# Patient Record
Sex: Male | Born: 1970 | Race: White | Hispanic: No | Marital: Married | Smoking: Never smoker
Health system: Southern US, Community
[De-identification: ages and names within clinical notes are randomized; demographics above are authoritative.]

## PROBLEM LIST (undated history)

## (undated) DIAGNOSIS — E669 Obesity, unspecified: Secondary | ICD-10-CM

## (undated) DIAGNOSIS — E785 Hyperlipidemia, unspecified: Secondary | ICD-10-CM

## (undated) DIAGNOSIS — N419 Inflammatory disease of prostate, unspecified: Secondary | ICD-10-CM

## (undated) HISTORY — DX: Hyperlipidemia, unspecified: E78.5

## (undated) HISTORY — PX: ANUS SURGERY: SHX302

## (undated) HISTORY — PX: OTHER SURGICAL HISTORY: SHX169

---

## 1999-10-06 ENCOUNTER — Encounter: Payer: Self-pay | Admitting: Emergency Medicine

## 1999-10-06 ENCOUNTER — Emergency Department (HOSPITAL_COMMUNITY): Admission: EM | Admit: 1999-10-06 | Discharge: 1999-10-06 | Payer: Self-pay | Admitting: Emergency Medicine

## 2001-11-06 ENCOUNTER — Emergency Department (HOSPITAL_COMMUNITY): Admission: EM | Admit: 2001-11-06 | Discharge: 2001-11-06 | Payer: Self-pay | Admitting: Emergency Medicine

## 2002-02-27 ENCOUNTER — Emergency Department (HOSPITAL_COMMUNITY): Admission: EM | Admit: 2002-02-27 | Discharge: 2002-02-27 | Payer: Self-pay | Admitting: Emergency Medicine

## 2002-02-27 ENCOUNTER — Encounter: Payer: Self-pay | Admitting: Emergency Medicine

## 2002-04-19 ENCOUNTER — Ambulatory Visit (HOSPITAL_COMMUNITY): Admission: RE | Admit: 2002-04-19 | Discharge: 2002-04-19 | Payer: Self-pay | Admitting: Sports Medicine

## 2002-04-19 ENCOUNTER — Encounter: Payer: Self-pay | Admitting: Sports Medicine

## 2002-05-25 ENCOUNTER — Encounter: Admission: RE | Admit: 2002-05-25 | Discharge: 2002-07-05 | Payer: Self-pay | Admitting: Sports Medicine

## 2005-08-09 ENCOUNTER — Emergency Department (HOSPITAL_COMMUNITY): Admission: EM | Admit: 2005-08-09 | Discharge: 2005-08-09 | Payer: Self-pay | Admitting: Emergency Medicine

## 2009-03-01 ENCOUNTER — Encounter: Admission: RE | Admit: 2009-03-01 | Discharge: 2009-03-01 | Payer: Self-pay | Admitting: Surgery

## 2011-01-24 ENCOUNTER — Other Ambulatory Visit: Payer: Self-pay

## 2011-01-24 ENCOUNTER — Encounter: Payer: Self-pay | Admitting: *Deleted

## 2011-01-24 ENCOUNTER — Emergency Department (HOSPITAL_BASED_OUTPATIENT_CLINIC_OR_DEPARTMENT_OTHER)
Admission: EM | Admit: 2011-01-24 | Discharge: 2011-01-25 | Disposition: A | Discharge status: Home / Self Care | Payer: Self-pay | Attending: Emergency Medicine | Admitting: Emergency Medicine

## 2011-01-24 ENCOUNTER — Emergency Department (INDEPENDENT_AMBULATORY_CARE_PROVIDER_SITE_OTHER): Payer: Self-pay

## 2011-01-24 DIAGNOSIS — R079 Chest pain, unspecified: Secondary | ICD-10-CM

## 2011-01-24 DIAGNOSIS — R0789 Other chest pain: Secondary | ICD-10-CM

## 2011-01-24 DIAGNOSIS — D7389 Other diseases of spleen: Secondary | ICD-10-CM

## 2011-01-24 DIAGNOSIS — N281 Cyst of kidney, acquired: Secondary | ICD-10-CM

## 2011-01-24 DIAGNOSIS — R109 Unspecified abdominal pain: Secondary | ICD-10-CM | POA: Insufficient documentation

## 2011-01-24 DIAGNOSIS — R52 Pain, unspecified: Secondary | ICD-10-CM

## 2011-01-24 DIAGNOSIS — S20219A Contusion of unspecified front wall of thorax, initial encounter: Secondary | ICD-10-CM | POA: Insufficient documentation

## 2011-01-24 DIAGNOSIS — W1789XA Other fall from one level to another, initial encounter: Secondary | ICD-10-CM | POA: Insufficient documentation

## 2011-01-24 DIAGNOSIS — W19XXXA Unspecified fall, initial encounter: Secondary | ICD-10-CM

## 2011-01-24 LAB — CBC
MCH: 31.6 pg (ref 26.0–34.0)
MCHC: 33.6 g/dL (ref 30.0–36.0)
RDW: 13 % (ref 11.5–15.5)

## 2011-01-24 LAB — COMPREHENSIVE METABOLIC PANEL
AST: 24 U/L (ref 0–37)
Albumin: 3.8 g/dL (ref 3.5–5.2)
Alkaline Phosphatase: 74 U/L (ref 39–117)
BUN: 24 mg/dL — ABNORMAL HIGH (ref 6–23)
Chloride: 105 mEq/L (ref 96–112)
Creatinine, Ser: 1.1 mg/dL (ref 0.50–1.35)
Potassium: 3.8 mEq/L (ref 3.5–5.1)
Total Bilirubin: 0.4 mg/dL (ref 0.3–1.2)
Total Protein: 7 g/dL (ref 6.0–8.3)

## 2011-01-24 LAB — DIFFERENTIAL
Basophils Absolute: 0.1 10*3/uL (ref 0.0–0.1)
Basophils Relative: 1 % (ref 0–1)
Eosinophils Absolute: 0.3 10*3/uL (ref 0.0–0.7)
Neutro Abs: 5.4 10*3/uL (ref 1.7–7.7)
Neutrophils Relative %: 60 % (ref 43–77)

## 2011-01-24 LAB — CARDIAC PANEL(CRET KIN+CKTOT+MB+TROPI)
CK, MB: 4.8 ng/mL — ABNORMAL HIGH (ref 0.3–4.0)
Relative Index: 1.3 (ref 0.0–2.5)
Total CK: 365 U/L — ABNORMAL HIGH (ref 7–232)

## 2011-01-24 MED ORDER — SODIUM CHLORIDE 0.9 % IV SOLN
INTRAVENOUS | Status: DC
  Administered 2011-01-24: 20:00:00 via INTRAVENOUS

## 2011-01-24 MED ORDER — FENTANYL CITRATE 0.05 MG/ML IJ SOLN
50.0000 ug | Freq: Once | INTRAMUSCULAR | Status: AC
  Administered 2011-01-24: 50 ug via INTRAVENOUS
  Filled 2011-01-24: qty 2

## 2011-01-24 MED ORDER — MORPHINE SULFATE 4 MG/ML IJ SOLN
4.0000 mg | Freq: Once | INTRAMUSCULAR | Status: AC
  Administered 2011-01-24: 4 mg via INTRAVENOUS
  Filled 2011-01-24: qty 1

## 2011-01-24 MED ORDER — IOHEXOL 350 MG/ML SOLN
80.0000 mL | Freq: Once | INTRAVENOUS | Status: AC | PRN
  Administered 2011-01-24: 80 mL via INTRAVENOUS

## 2011-01-24 NOTE — ED Notes (Signed)
Patient transported to CT 

## 2011-01-24 NOTE — ED Notes (Signed)
Pt states that he would like something else for his pain, fentanyl did not provide this patient with much relief, per pt.  Dr Nicanor Alcon notified.

## 2011-01-24 NOTE — ED Provider Notes (Signed)
History  Scribed for Scott Larson Smitty Cords, MD, the patient was seen in room MH05/MH05. This chart was scribed by Candelaria Stagers. The patient's care started at 8:03 PM    CSN: [id number] Arrival date & time: 01/24/2011  7:54 PM   First MD Initiated Contact with Patient 01/24/11 1955      Chief Complaint  Patient presents with  . Rib Injury     Patient is a 40 y.o. male presenting with fall. The history is provided by the patient.  Fall The accident occurred more than 1 week ago. The fall occurred while recreating/playing (jumped on a 4 foot tall beach ball that popped and has had right rib pain since.  No DOE, no SOB, no n/v/d.  No long car trips or plane trips no swelling of the lower extremities.  ). He fell from a height of 6 to 10 ft. He landed on dirt. There was no blood loss. Point of impact: chest. Pain location: ribs right > left. The pain is at a severity of 9/10. The pain is severe. He was not ambulatory at the scene. There was no entrapment after the fall. There was no drug use involved in the accident. There was no alcohol use involved in the accident. Pertinent negatives include no visual change, no fever, no numbness, no abdominal pain, no bowel incontinence, no nausea, no vomiting, no hematuria, no headaches and no loss of consciousness. The symptoms are aggravated by sitting (palpation). He has tried nothing for the symptoms. The treatment provided no relief.   AREK Larson is a 40 y.o. male who presents to the Emergency Department complaining of constant rib pain after experiencing a fall from 20ft two weeks ago.  Pt states that the pain is worse today.  Sitting and pressing on the area makes the pain worse.  He denies swelling, SOB, back pain, or nausea.  He reports that he has not travelled recently.  He has not taken any medication for the pain.       History reviewed. No pertinent past medical history.  History reviewed. No pertinent past surgical history.  History  reviewed. No pertinent family history.  History  Substance Use Topics  . Smoking status: Never Smoker   . Smokeless tobacco: Not on file  . Alcohol Use: Yes      Review of Systems  Constitutional: Negative for fever and chills.  HENT: Negative for neck pain and neck stiffness.   Eyes: Negative for pain.  Respiratory: Negative for chest tightness and shortness of breath.   Cardiovascular: Positive for chest pain (rib pain).  Gastrointestinal: Negative for nausea, vomiting, abdominal pain, diarrhea and bowel incontinence.  Genitourinary: Negative for dysuria and hematuria.  Musculoskeletal: Negative for back pain, joint swelling and gait problem.  Skin: Negative for rash.  Neurological: Negative for loss of consciousness, light-headedness, numbness and headaches.  Hematological: Negative.   Psychiatric/Behavioral: Negative for confusion and agitation.    Allergies  Aspirin  Home Medications   Current Outpatient Rx  Name Route Sig Dispense Refill  . ACYCLOVIR 400 MG PO TABS Oral Take 400 mg by mouth daily as needed. For breakout     . NAPHAZOLINE HCL 0.012 % OP SOLN Both Eyes Place 1 drop into both eyes once as needed. For dry eyes     . OVER THE COUNTER MEDICATION Oral Take 1 capsule by mouth daily. Mushroom capsule supplement     . OVER THE COUNTER MEDICATION Oral Take 1 capsule by mouth daily. Immune  defense supplement       BP 125/77  Pulse 58  Temp(Src) 97.9 F (36.6 C) (Oral)  Resp 20  Ht 6' (1.829 m)  Wt 275 lb (124.739 kg)  BMI 37.30 kg/m2  SpO2 99%  Physical Exam  Nursing note and vitals reviewed. Constitutional: He appears well-developed and well-nourished. No distress.  HENT:  Head: Normocephalic and atraumatic.  Mouth/Throat: Oropharynx is clear and moist. No oropharyngeal exudate.  Eyes: Conjunctivae and EOM are normal. Pupils are equal, round, and reactive to light.  Neck: No JVD present. No thyromegaly present.  Cardiovascular: Normal rate and  regular rhythm.        normal S1, S2  Pulmonary/Chest: Effort normal. No stridor. No respiratory distress. He has no wheezes. He has no rales. He exhibits tenderness.       No Crepitus.   Abdominal: Soft. Bowel sounds are normal. He exhibits no distension and no mass. There is no tenderness. There is no guarding.       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    Musculoskeletal:       No point tenderness over the spine.   Lymphadenopathy:    He has no cervical adenopathy.  Neurological: He is alert.       L5, L1 intact.  No loss of sensation.   Skin: Skin is warm and dry.  Psychiatric: He has a normal mood and affect.    ED Course  Procedures  DIAGNOSTIC STUDIES: Oxygen Saturation is 99% on room air, normal by my interpretation.    COORDINATION OF CARE:  8:12Pm Ordered: CBC ; Differential ; Comprehensive metabolic panel ; Cardiac panel Timed (cret kin+cktot+mb+tropi)  8:20PM Ordered: DG CHEST 2 VIEW,fentaNYL (SUBLIMAZE) injection 50 mcg  8:27PM Ordered: 0.9 % sodium chloride infusion - Rate: 20 mL/hr ; Route: Intravenous ; Scheduled Time: 2030  8:59PM Ordered: morphine 4 MG/ML injection 4 mg  10:11PM Ordered: CT ANGIO CHEST W/CM &/OR WO CM  10:22PM Ordered: CT ABDOMEN PELVIS W CONTRAST  10:45PM Ordered: iohexol (OMNIPAQUE) 350 MG/ML injection 80 mL  12:46AM Recheck: Discussed results with pt and course of care.   Labs Reviewed  COMPREHENSIVE METABOLIC PANEL - Abnormal; Notable for the following:    BUN 24 (*)    GFR calc non Af Amer 81 (*)    All other components within normal limits  CARDIAC PANEL(CRET KIN+CKTOT+MB+TROPI) - Abnormal; Notable for the following:    Total CK 365 (*)    CK, MB 4.8 (*)    All other components within normal limits  CBC  DIFFERENTIAL   Dg Chest 2 View  01/24/2011  *RADIOLOGY REPORT*  Clinical Data: Fall, pain  CHEST - 2 VIEW  Comparison: 03/01/2009  Findings: Cardiac and mediastinal contours are normal.  Lungs are clear without  infiltrate or effusion.  IMPRESSION: No active cardiopulmonary disease.  Original Report Authenticated By: Camelia Phenes, M.D.   Ct Angio Chest W/cm &/or Wo Cm  01/24/2011  *RADIOLOGY REPORT*  Clinical Data:  Chest pain  CT ANGIOGRAPHY CHEST WITH CONTRAST  Technique:  Multidetector CT imaging of the chest was performed using the standard protocol during bolus administration of intravenous contrast.  Multiplanar CT image reconstructions including MIPs were obtained to evaluate the vascular anatomy.  Contrast: 80mL OMNIPAQUE IOHEXOL 350 MG/ML IV SOLN  Comparison:  01/24/2011 radiograph  Findings:  Pulmonary arterial branches are patent.  Normal caliber aorta and great vessels.  Normal heart size.  No pleural or pericardial effusion.  No intrathoracic lymphadenopathy.  Central airways are patent.  Lungs are clear.  No pneumothorax.  Limited images through the upper abdomen show no acute abnormality. See contemporaneous abdominal CT report.  Multilevel degenerative changes.  No acute osseous abnormality.  Review of the MIP images confirms the above findings.  IMPRESSION: No pulmonary embolism or acute intrathoracic process.  Original Report Authenticated By: Waneta Martins, M.D.   Ct Abdomen Pelvis W Contrast  01/24/2011  *RADIOLOGY REPORT*  Clinical Data: Left-sided chest discomfort and left flank pain.  CT ABDOMEN AND PELVIS WITH CONTRAST  Technique:  Multidetector CT imaging of the abdomen and pelvis was performed following the standard protocol during bolus administration of intravenous contrast.  Contrast:  80 ml Omnipaque 350  Comparison: None.  Findings: Lung bases are clear.  Punctate splenic calcification is in keeping with prior granulomas infection.  Unremarkable liver, spleen, pancreas, adrenal glands, and left kidney.  There is a 1.5 cm simple cyst arising from the interpolar right kidney laterally.  No hydronephrosis or hydroureter.  No bowel obstruction or CT evidence for colitis.  Normal  appendix. No free intraperitoneal air or fluid.  No lymphadenopathy.  There is scattered atherosclerotic calcification of the aorta and its branches. No aneurysmal dilatation.  Thin-walled bladder.  There is no acute osseous abnormality.  IMPRESSION: No acute or traumatic abnormality within the abdomen pelvis.  Original Report Authenticated By: Waneta Martins, M.D.     No diagnosis found.   Date: 01/25/2011  Rate: 47  Rhythm: sinus bradycardia  QRS Axis: normal  Intervals: normal  ST/T Wave abnormalities: normal  Conduction Disutrbances:none  Narrative Interpretation:   Old EKG Reviewed: unchanged   MDM  Patient states adamantly the pain is not related to his heart and he feels fine post meds.  Denies DOE, SOB no nausea vomiting or diaphoresis.  States only thing that makes it worse is palpation and he was mainly concerned about an injury to his liver.  Refusing admission at this time.  States he will follow up with his doctor for ongoing care.  Told to return no dyspnea on exertion shortness of breath nausea vomiting or diaphoresis.  Patient verbalizes understanding and agrees to follow up.  PERC negative.   I personally performed the services described in this documentation, which was scribed in my presence. The recorded information has been reviewed and considered.       Andi Mahaffy Smitty Cords, MD 01/25/11 0110

## 2011-01-24 NOTE — ED Notes (Signed)
Pt describes right rib and RUQ pain x 2 weeks. Worse tonight. Hx fall 2 weeks ago. States he can "feel something moving" when he presses on it. Denies other s/s

## 2011-01-25 LAB — CARDIAC PANEL(CRET KIN+CKTOT+MB+TROPI)
CK, MB: 4.8 ng/mL — ABNORMAL HIGH (ref 0.3–4.0)
Total CK: 386 U/L — ABNORMAL HIGH (ref 7–232)

## 2011-01-25 MED ORDER — HYDROCODONE-ACETAMINOPHEN 5-500 MG PO TABS: 2.0000 | ORAL_TABLET | Freq: Four times a day (QID) | ORAL | Status: AC | PRN

## 2011-01-25 NOTE — ED Notes (Signed)
D/c home- no rx given- EDP Palumbo advised of pt's heartrate 45 at d/c- states ok to d/c pt home

## 2011-02-15 ENCOUNTER — Emergency Department (HOSPITAL_BASED_OUTPATIENT_CLINIC_OR_DEPARTMENT_OTHER)
Admission: EM | Admit: 2011-02-15 | Discharge: 2011-02-15 | Discharge status: Against Medical Advice | Payer: Self-pay | Attending: Emergency Medicine | Admitting: Emergency Medicine

## 2011-02-15 ENCOUNTER — Encounter (HOSPITAL_BASED_OUTPATIENT_CLINIC_OR_DEPARTMENT_OTHER): Payer: Self-pay | Admitting: *Deleted

## 2011-02-15 DIAGNOSIS — R109 Unspecified abdominal pain: Secondary | ICD-10-CM | POA: Insufficient documentation

## 2011-02-15 HISTORY — DX: Inflammatory disease of prostate, unspecified: N41.9

## 2011-02-15 LAB — URINALYSIS, ROUTINE W REFLEX MICROSCOPIC
Bilirubin Urine: NEGATIVE
Glucose, UA: NEGATIVE mg/dL
Ketones, ur: NEGATIVE mg/dL
Protein, ur: NEGATIVE mg/dL
pH: 5.5 (ref 5.0–8.0)

## 2011-02-15 NOTE — ED Notes (Signed)
Pt called for the 3rd time. No answer.

## 2011-02-15 NOTE — ED Notes (Signed)
Patient called to be roomed and no answer. Triage RN aware

## 2011-02-15 NOTE — ED Notes (Signed)
Pt states he is having s/s similar to when he had prostatitis. Groin pain. Feels like "heat". Urgency.

## 2011-02-16 ENCOUNTER — Encounter (HOSPITAL_BASED_OUTPATIENT_CLINIC_OR_DEPARTMENT_OTHER): Payer: Self-pay | Admitting: Family Medicine

## 2011-02-16 ENCOUNTER — Emergency Department (HOSPITAL_BASED_OUTPATIENT_CLINIC_OR_DEPARTMENT_OTHER)
Admission: EM | Admit: 2011-02-16 | Discharge: 2011-02-16 | Disposition: A | Discharge status: Home / Self Care | Payer: Self-pay | Attending: Emergency Medicine | Admitting: Emergency Medicine

## 2011-02-16 DIAGNOSIS — R109 Unspecified abdominal pain: Secondary | ICD-10-CM | POA: Insufficient documentation

## 2011-02-16 DIAGNOSIS — N419 Inflammatory disease of prostate, unspecified: Secondary | ICD-10-CM | POA: Insufficient documentation

## 2011-02-16 DIAGNOSIS — R300 Dysuria: Secondary | ICD-10-CM | POA: Insufficient documentation

## 2011-02-16 DIAGNOSIS — Z79899 Other long term (current) drug therapy: Secondary | ICD-10-CM | POA: Insufficient documentation

## 2011-02-16 LAB — URINALYSIS, ROUTINE W REFLEX MICROSCOPIC
Bilirubin Urine: NEGATIVE
Glucose, UA: NEGATIVE mg/dL
Hgb urine dipstick: NEGATIVE
Ketones, ur: NEGATIVE mg/dL
Specific Gravity, Urine: 1.025 (ref 1.005–1.030)
pH: 7 (ref 5.0–8.0)

## 2011-02-16 MED ORDER — CIPROFLOXACIN HCL 500 MG PO TABS
500.0000 mg | ORAL_TABLET | Freq: Once | ORAL | Status: AC
  Administered 2011-02-16: 500 mg via ORAL
  Filled 2011-02-16: qty 1

## 2011-02-16 MED ORDER — AZITHROMYCIN 250 MG PO TABS
1000.0000 mg | ORAL_TABLET | Freq: Once | ORAL | Status: AC
  Administered 2011-02-16: 1000 mg via ORAL
  Filled 2011-02-16: qty 4

## 2011-02-16 MED ORDER — CIPROFLOXACIN HCL 500 MG PO TABS: 500.0000 mg | ORAL_TABLET | Freq: Two times a day (BID) | ORAL | Status: AC

## 2011-02-16 MED ORDER — ACYCLOVIR 400 MG PO TABS: 400.0000 mg | ORAL_TABLET | Freq: Four times a day (QID) | ORAL | Status: AC

## 2011-02-16 MED ORDER — CEFTRIAXONE SODIUM 250 MG IJ SOLR
250.0000 mg | Freq: Once | INTRAMUSCULAR | Status: AC
  Administered 2011-02-16: 250 mg via INTRAMUSCULAR
  Filled 2011-02-16: qty 250

## 2011-02-16 NOTE — ED Notes (Signed)
Pt c/o "pinching sensation at tip of my penis".  Pt also c/o "warmness sensation from anus to penis". Pt able to urinate.

## 2011-02-16 NOTE — ED Notes (Signed)
meds given to Pt.   Introduced self to pt. And plan of care.  No needs per pt.

## 2011-02-16 NOTE — ED Provider Notes (Signed)
History     CSN: [id number]  Arrival date & time 02/16/11  1132   First MD Initiated Contact with Patient 02/16/11 1315      Chief Complaint  Patient presents with  . Groin Pain    (Consider location/radiation/quality/duration/timing/severity/associated sxs/prior treatment) HPI Comments: Describes a pinching and burning sensation at the tip of the penis as well as the scrotum and rectum. Has a history of prostatitis in the past. States feels similar. Also requests a prescription for acyclovir as he feels like he is getting a cold sore. Concern about STI has his wife and him were separated for 2 months and he fears that she was not monogamous.  Patient is a 41 y.o. male presenting with groin pain. The history is provided by the patient. No language interpreter was used.  Groin Pain This is a new problem. Episode onset: 1-2 weeks. The problem occurs constantly. The problem has been gradually worsening. Pertinent negatives include no chest pain, no abdominal pain, no headaches and no shortness of breath. Exacerbated by: urinating. The symptoms are relieved by nothing. He has tried nothing for the symptoms.    Past Medical History  Diagnosis Date  . Prostatitis     Past Surgical History  Procedure Date  . Anus surgery     No family history on file.  History  Substance Use Topics  . Smoking status: Never Smoker   . Smokeless tobacco: Not on file  . Alcohol Use: Yes      Review of Systems  Constitutional: Negative for fever, activity change, appetite change and fatigue.  HENT: Negative for congestion, sore throat, rhinorrhea, neck pain and neck stiffness.   Respiratory: Negative for cough and shortness of breath.   Cardiovascular: Negative for chest pain and palpitations.  Gastrointestinal: Negative for nausea, vomiting and abdominal pain.  Genitourinary: Positive for dysuria, penile pain and testicular pain. Negative for urgency, frequency, flank pain, discharge, penile  swelling, scrotal swelling, difficulty urinating and genital sores.  Musculoskeletal: Negative for myalgias, back pain and arthralgias.  Neurological: Negative for dizziness, weakness, light-headedness, numbness and headaches.  All other systems reviewed and are negative.    Allergies  Aspirin  Home Medications   Current Outpatient Rx  Name Route Sig Dispense Refill  . ACYCLOVIR 400 MG PO TABS Oral Take 400 mg by mouth daily as needed. For breakout     . ACYCLOVIR 400 MG PO TABS Oral Take 1 tablet (400 mg total) by mouth 4 (four) times daily. 50 tablet 0  . CIPROFLOXACIN HCL 500 MG PO TABS Oral Take 1 tablet (500 mg total) by mouth every 12 (twelve) hours. 60 tablet 0  . OVER THE COUNTER MEDICATION Oral Take 1 capsule by mouth daily. Mushroom capsule supplement     . OVER THE COUNTER MEDICATION Oral Take 1 capsule by mouth daily. Immune defense supplement       BP 142/80  Pulse 50  Temp(Src) 97 F (36.1 C) (Oral)  Resp 16  Ht 6' (1.829 m)  Wt 290 lb (131.543 kg)  BMI 39.33 kg/m2  SpO2 97%  Physical Exam  Nursing note and vitals reviewed. Constitutional: He is oriented to person, place, and time. He appears well-developed and well-nourished. No distress.  HENT:  Head: Normocephalic and atraumatic.  Mouth/Throat: Oropharynx is clear and moist.  Eyes: Conjunctivae and EOM are normal. Pupils are equal, round, and reactive to light.  Neck: Normal range of motion. Neck supple.  Cardiovascular: Normal rate, regular rhythm, normal heart sounds  and intact distal pulses.  Exam reveals no gallop and no friction rub.   No murmur heard. Pulmonary/Chest: Effort normal and breath sounds normal.  Abdominal: Soft. There is no tenderness. Hernia confirmed negative in the right inguinal area and confirmed negative in the left inguinal area.  Genitourinary: Rectum normal, testes normal and penis normal. Prostate is tender. Cremasteric reflex is present. Circumcised.  Musculoskeletal: Normal  range of motion. He exhibits no tenderness.  Lymphadenopathy:       Right: No inguinal adenopathy present.       Left: No inguinal adenopathy present.  Neurological: He is alert and oriented to person, place, and time. No cranial nerve deficit.  Skin: Skin is warm and dry.    ED Course  Procedures (including critical care time)   Labs Reviewed  URINALYSIS, ROUTINE W REFLEX MICROSCOPIC  GC/CHLAMYDIA PROBE AMP, URINE   No results found.   1. Prostatitis       MDM  Urinalysis was performed and unremarkable. A dirty urine will be performed to rule out GC and Chlamydia. Patient states he will perform additional testing via the health department. I treated him empirically for gonorrhea and Chlamydia. I will also treat him with Cipro for prostatitis. There is no indication for imaging at this time. He will also be given a prescription for acyclovir per his request. Instructed to followup with his primary care physician as well as urology        Dayton Bailiff, MD 02/16/11 1443

## 2011-02-17 LAB — GC/CHLAMYDIA PROBE AMP, URINE: Chlamydia, Swab/Urine, PCR: NEGATIVE

## 2011-06-08 ENCOUNTER — Encounter (HOSPITAL_BASED_OUTPATIENT_CLINIC_OR_DEPARTMENT_OTHER): Payer: Self-pay | Admitting: *Deleted

## 2011-06-08 ENCOUNTER — Emergency Department (HOSPITAL_BASED_OUTPATIENT_CLINIC_OR_DEPARTMENT_OTHER)
Admission: EM | Admit: 2011-06-08 | Discharge: 2011-06-08 | Disposition: A | Discharge status: Home / Self Care | Payer: Self-pay | Attending: Emergency Medicine | Admitting: Emergency Medicine

## 2011-06-08 DIAGNOSIS — R599 Enlarged lymph nodes, unspecified: Secondary | ICD-10-CM | POA: Insufficient documentation

## 2011-06-08 DIAGNOSIS — R51 Headache: Secondary | ICD-10-CM | POA: Insufficient documentation

## 2011-06-08 DIAGNOSIS — J329 Chronic sinusitis, unspecified: Secondary | ICD-10-CM | POA: Insufficient documentation

## 2011-06-08 DIAGNOSIS — J309 Allergic rhinitis, unspecified: Secondary | ICD-10-CM | POA: Insufficient documentation

## 2011-06-08 LAB — RAPID STREP SCREEN (MED CTR MEBANE ONLY): Streptococcus, Group A Screen (Direct): NEGATIVE

## 2011-06-08 MED ORDER — OXYMETAZOLINE HCL 0.05 % NA SOLN: 2.0000 | Freq: Two times a day (BID) | NASAL | Status: AC

## 2011-06-08 MED ORDER — AMOXICILLIN-POT CLAVULANATE 875-125 MG PO TABS: 1.0000 | ORAL_TABLET | Freq: Two times a day (BID) | ORAL | Status: AC

## 2011-06-08 NOTE — ED Notes (Signed)
Wants to make sure he does not have a sinus infection. Facial pain and sore throat.

## 2011-06-08 NOTE — ED Provider Notes (Signed)
History   This chart was scribed for Forbes Cellar, MD by Melba Coon. The patient was seen in room MH11/MH11 and the patient's care was started at 3:45PM.    CSN: [id number]  Arrival date & time 06/08/11  1446   First MD Initiated Contact with Patient 06/08/11 1538      No chief complaint on file.   (Consider location/radiation/quality/duration/timing/severity/associated sxs/prior treatment) HPI Scott Larson is a 41 y.o. male who presents to the Emergency Department complaining of   Julieanne Manson, RN 06/08/2011 14:54 Wants to make sure he does not have a sinus infection. Facial pain and sore throat.    Pt thinks he got bronchitis; took augmentin (10-12 over 3 day period) about 2.5 weeks ago for sinus pressure and productive cough. About a week ago, throat pressure on right side; this week pressure in throat is on left side and also left side of head is pounding around left eyebrow. Pt aggressively scratch throat during night to the point where it wakes up pt's wife. Pt also shows a picture of "fleshy red" phlegm that he coughed up in November. Pt hasn't been to PCP in many years. Dry cough is now present. SOB present. No HA, otalgia, fever, neck pain, sore throat, rash, back pain, CP, abd pain, n/v/d, dysuria, or extremity pain, edema, weakness, numbness, or tingling. Allergic to ASA; has seasonal allergies as well; takes zyrtec. Hx of acid reflux; says it feels slightly similar to present symptoms but also different. No other pertinent medical symptoms.  PCP: Dr. Lorenz Coaster   ED Notes, ED Provider Notes from 06/08/11 0000 to 06/08/11 14:57:04       Julieanne Manson, RN 06/08/2011 14:54      Wants to make sure he does not have a sinus infection. Facial pain and sore throat.      Past Medical History  Diagnosis Date  . Prostatitis   . Asthma     Past Surgical History  Procedure Date  . Anus surgery     No family history on file.  History  Substance Use Topics  .  Smoking status: Never Smoker   . Smokeless tobacco: Not on file  . Alcohol Use: Yes      Review of Systems 10 Systems reviewed and all are negative for acute change except as noted in the HPI.   Allergies  Aspirin  Home Medications   Current Outpatient Rx  Name Route Sig Dispense Refill  . ALBUTEROL SULFATE HFA 108 (90 BASE) MCG/ACT IN AERS Inhalation Inhale into the lungs every 6 (six) hours as needed. Patient used this medication for shortness of breath.    . AMOXICILLIN-POT CLAVULANATE 875-125 MG PO TABS Oral Take 1 tablet by mouth 2 (two) times daily. 20 tablet 0  . OXYMETAZOLINE HCL 0.05 % NA SOLN Nasal Place 2 sprays into the nose 2 (two) times daily. 30 mL 0    BP 142/83  Pulse 61  Temp(Src) 98.1 F (36.7 C) (Oral)  Resp 20  SpO2 100%  Physical Exam  Nursing note and vitals reviewed. Constitutional: He is oriented to person, place, and time. He appears well-developed and well-nourished.       Awake, alert, nontoxic appearance.  HENT:  Head: Normocephalic and atraumatic.       Left Ear: Middle ear effusion around the left TM; non-infected Throat: No tonsillar swelling with minimal erythema; minimal white exudate on tonsils Head: No ttp over maxillary sinus; minimal tenderness on left ventril sinus  Eyes: EOM are normal. Pupils are equal, round, and reactive to light. Right eye exhibits no discharge. Left eye exhibits no discharge.  Neck: Normal range of motion. Neck supple.       Lt submandibular tender mobile LAD  Cardiovascular: Normal rate and normal heart sounds.   No murmur heard. Pulmonary/Chest: Effort normal. He exhibits no tenderness.  Abdominal: Soft. There is no tenderness. There is no rebound.  Musculoskeletal: He exhibits no tenderness.       Baseline ROM, no obvious new focal weakness.  Neurological: He is alert and oriented to person, place, and time.       Mental status and motor strength appears baseline for patient and situation.  Skin: Skin  is warm. No rash noted.  Psychiatric: He has a normal mood and affect. His behavior is normal.    ED Course  Procedures (including critical care time)  DIAGNOSTIC STUDIES: Oxygen Saturation is 100% on room air, normal by my interpretation.    COORDINATION OF CARE:  3:50PM - EDMD will order strep test for the pt.    Labs Reviewed  RAPID STREP SCREEN   No results found.   1. Sinusitis   2. Allergic rhinitis     MDM  Likely allergic rhinitis. Continue zyrtec. Afrin. Will give watch and wait prescription for sinusitis. No fevers at this time but does have tenderness to percussion frontal sinuses. No EMC precluding discharge at this time. Given Precautions for return. PMD f/u.   I personally performed the services described in this documentation, which was scribed in my presence. The recorded information has been reviewed and considered.         Forbes Cellar, MD 06/08/11 [phone]

## 2011-06-08 NOTE — Discharge Instructions (Signed)
Allergic Rhinitis Allergic rhinitis is when the mucous membranes in the nose respond to allergens. Allergens are particles in the air that cause your body to have an allergic reaction. This causes you to release allergic antibodies. Through a chain of events, these eventually cause you to release histamine into the blood stream (hence the use of antihistamines). Although meant to be protective to the body, it is this release that causes your discomfort, such as frequent sneezing, congestion and an itchy runny nose.  CAUSES  The pollen allergens may come from grasses, trees, and weeds. This is seasonal allergic rhinitis, or "hay fever." Other allergens cause year-round allergic rhinitis (perennial allergic rhinitis) such as house dust mite allergen, pet dander and mold spores.  SYMPTOMS   Nasal stuffiness (congestion).   Runny, itchy nose with sneezing and tearing of the eyes.   There is often an itching of the mouth, eyes and ears.  It cannot be cured, but it can be controlled with medications. DIAGNOSIS  If you are unable to determine the offending allergen, skin or blood testing may find it. TREATMENT   Avoid the allergen.   Medications and allergy shots (immunotherapy) can help.   Hay fever may often be treated with antihistamines in pill or nasal spray forms. Antihistamines block the effects of histamine. There are over-the-counter medicines that may help with nasal congestion and swelling around the eyes. Check with your caregiver before taking or giving this medicine.  If the treatment above does not work, there are many new medications your caregiver can prescribe. Stronger medications may be used if initial measures are ineffective. Desensitizing injections can be used if medications and avoidance fails. Desensitization is when a patient is given ongoing shots until the body becomes less sensitive to the allergen. Make sure you follow up with your caregiver if problems continue. SEEK  MEDICAL CARE IF:   You develop fever (more than 100.5 F (38.1 C).   You develop a cough that does not stop easily (persistent).   You have shortness of breath.   You start wheezing.   Symptoms interfere with normal daily activities.  Document Released: 10/21/2000 Document Revised: 01/15/2011 Document Reviewed: 05/02/2008 Exit[employer] Patient Information 2012 [employer]  RESOURCE GUIDE  Dental Problems  Patients with Medicaid: [facility]                     [facility] [address]                                           [address] Phone:  [phone]                                                   Phone:  [phone]  If unable to pay or uninsured, contact:  [facility] or [facility] to become qualified for the adult dental clinic.  Chronic Pain Problems Contact [facility]  [phone] Patients need to be referred by their primary care doctor.  Insufficient Money for Medicine Contact [employer]:  call "211" or [facility] 271-5999.  No Primary Care Doctor Call [facility]  [phone] Other agencies that provide inexpensive medical care      [facility]  [phone]    [facility]  [phone]    [facility]  [phone]    [facility]  [phone]    [facility]  [phone]    [facility]  [phone]  [facility]  [phone] [facility]  [phone] [facility]   [phone] (emergency services [phone])  Abuse/Neglect [place] Child Abuse Hotline (336) 641-3795 [place] [facility] Hotline 800-378-5315 (After Hours)  Emergency Shelter [facility] (336) 271-5985  [facility] (336) 275-9566 [facility] (704) 372-4663  MRSA Hotline #:   832-7006    [place] [facility]  [facility]  [place]  [employer]                           [facility] [address] [place]                     [address]         [address]  [place]                                               [place]                              [place] Phone:  [phone]                                  Phone:  [phone]                   Phone:  [phone]  [facility] Phone:  [phone]  [place] Child Abuse Hotline (336) 342-1394 (336) 342-3537 (After Hours)  

## 2011-06-09 LAB — STREP A DNA PROBE
Group A Strep Probe: NEGATIVE
Special Requests: NORMAL

## 2011-09-07 ENCOUNTER — Encounter (HOSPITAL_BASED_OUTPATIENT_CLINIC_OR_DEPARTMENT_OTHER): Payer: Self-pay | Admitting: Emergency Medicine

## 2011-09-07 ENCOUNTER — Emergency Department (HOSPITAL_BASED_OUTPATIENT_CLINIC_OR_DEPARTMENT_OTHER)
Admission: EM | Admit: 2011-09-07 | Discharge: 2011-09-07 | Disposition: A | Discharge status: Home / Self Care | Payer: Self-pay | Attending: Emergency Medicine | Admitting: Emergency Medicine

## 2011-09-07 DIAGNOSIS — Z202 Contact with and (suspected) exposure to infections with a predominantly sexual mode of transmission: Secondary | ICD-10-CM | POA: Insufficient documentation

## 2011-09-07 LAB — URINALYSIS, ROUTINE W REFLEX MICROSCOPIC
Bilirubin Urine: NEGATIVE
Hgb urine dipstick: NEGATIVE
Ketones, ur: NEGATIVE mg/dL
Specific Gravity, Urine: 1.025 (ref 1.005–1.030)
Urobilinogen, UA: 0.2 mg/dL (ref 0.0–1.0)

## 2011-09-07 MED ORDER — LIDOCAINE HCL (PF) 1 % IJ SOLN
INTRAMUSCULAR | Status: AC
  Administered 2011-09-07: 5 mL
  Filled 2011-09-07: qty 5

## 2011-09-07 MED ORDER — CEFTRIAXONE SODIUM 250 MG IJ SOLR
250.0000 mg | Freq: Once | INTRAMUSCULAR | Status: AC
  Administered 2011-09-07: 250 mg via INTRAMUSCULAR
  Filled 2011-09-07: qty 250

## 2011-09-07 MED ORDER — AZITHROMYCIN 250 MG PO TABS
1000.0000 mg | ORAL_TABLET | Freq: Once | ORAL | Status: AC
  Administered 2011-09-07: 1000 mg via ORAL
  Filled 2011-09-07: qty 4

## 2011-09-07 NOTE — ED Notes (Signed)
Concerned about STD exposure because he had been separated from his wife and recently got back together.  They were sexually active and she confessed she had slept with another man during the break up.  He is now concerned that she may have been exposed and now he is too.  No symptoms from either.

## 2011-09-08 LAB — GC/CHLAMYDIA PROBE AMP, GENITAL: Chlamydia, DNA Probe: NEGATIVE

## 2011-09-11 NOTE — ED Provider Notes (Signed)
History     CSN: [id number]  Arrival date & time 09/07/11  1324   First MD Initiated Contact with Patient 09/07/11 [phone]      Chief Complaint  Patient presents with  . Exposure to STD    (Consider location/radiation/quality/duration/timing/severity/associated sxs/prior treatment) HPI Patient is a 41 yo very anxious male presenting with concern over increased urinary frequency as well as possible STI exposure.  Patient had separated from wife and after reconciliation and unprotected sex he was notified that she had had protected sex with a new partner.  Since that time he has been "obsessing" that he has an STI.  Denies h/o STI, penile d/c, testicular pain or masses, penile pain, genital lesions, or other symptoms.  He does endorse some abdominal pain and nausea that he associates with nerves as well as increased urinary frequency.  Denies other urinary symptoms, fever, or back pain.  Patient plans to get complete panel of ST testing but is very anxious due to urinary symptoms.  There are no other associated or modifying factors.  Past Medical History  Diagnosis Date  . Prostatitis   . Asthma     Past Surgical History  Procedure Date  . Anus surgery     History reviewed. No pertinent family history.  History  Substance Use Topics  . Smoking status: Never Smoker   . Smokeless tobacco: Not on file  . Alcohol Use: Yes      Review of Systems  Constitutional: Negative.   HENT: Negative.   Eyes: Negative.   Respiratory: Negative.   Cardiovascular: Negative.   Gastrointestinal: Positive for nausea and abdominal pain.  Genitourinary: Positive for frequency.  Musculoskeletal: Negative.   Skin: Negative.   Neurological: Negative.   Hematological: Negative.   Psychiatric/Behavioral: Negative.   All other systems reviewed and are negative.    Allergies  Aspirin  Home Medications   Current Outpatient Rx  Name Route Sig Dispense Refill  . ALBUTEROL SULFATE HFA 108 (90  BASE) MCG/ACT IN AERS Inhalation Inhale into the lungs every 6 (six) hours as needed. Patient used this medication for shortness of breath.      BP 121/74  Pulse 52  Temp 97.9 F (36.6 C) (Oral)  Resp 16  Ht 6' (1.829 m)  Wt 320 lb (145.151 kg)  BMI 43.40 kg/m2  SpO2 99%  Physical Exam  Nursing note and vitals reviewed. GEN: Well-developed, well-nourished male in no distress HEENT: Atraumatic, normocephalic. Oropharynx clear without erythema EYES: PERRLA BL, no scleral icterus. NECK: Trachea midline, no meningismus CV: regular rate and rhythm. No murmurs, rubs, or gallops PULM: No respiratory distress.  No crackles, wheezes, or rales. GI: soft, non-tender. No guarding, rebound, or tenderness. + bowel sounds  GU: circumcised male, no penile d/c or lesions, no testicular TTP, no scrotal swelling, testicular masses, or inguinal lymphadenopathy Neuro: cranial nerves grossly 2-12 intact, no abnormalities of strength or sensation, A and O x 3 MSK: Patient moves all 4 extremities symmetrically, no deformity, edema, or injury noted Skin: No rashes petechiae, purpura, or jaundice Psych: very anxious. Denies SI, HI, or hallucinations   ED Course  Procedures (including critical care time)   Labs Reviewed  URINALYSIS, ROUTINE W REFLEX MICROSCOPIC  GC/CHLAMYDIA PROBE AMP, GENITAL   No results found.   1. Possible exposure to STD       MDM  Patient evaluated by myself.  Based on findings patient had UA with no findings.  Patient had GC swab sent.  Patient and  I discussed that I felt he had low risk of contracting STI based on history but patient was very anxious and felt he would actually be able to relax if he knew he had been prophylactically treated.  Patient given Rocephin and azithro and d/c'ed home.  Patient will follow-up for additional testing as previously planned.        Cyndra Numbers, MD 09/11/11 [phone]

## 2011-11-24 ENCOUNTER — Emergency Department (HOSPITAL_BASED_OUTPATIENT_CLINIC_OR_DEPARTMENT_OTHER)
Admission: EM | Admit: 2011-11-24 | Discharge: 2011-11-24 | Disposition: A | Discharge status: Home / Self Care | Payer: Self-pay | Attending: Emergency Medicine | Admitting: Emergency Medicine

## 2011-11-24 ENCOUNTER — Encounter (HOSPITAL_BASED_OUTPATIENT_CLINIC_OR_DEPARTMENT_OTHER): Payer: Self-pay | Admitting: *Deleted

## 2011-11-24 DIAGNOSIS — J45909 Unspecified asthma, uncomplicated: Secondary | ICD-10-CM | POA: Insufficient documentation

## 2011-11-24 DIAGNOSIS — R109 Unspecified abdominal pain: Secondary | ICD-10-CM | POA: Insufficient documentation

## 2011-11-24 LAB — URINALYSIS, ROUTINE W REFLEX MICROSCOPIC
Bilirubin Urine: NEGATIVE
Glucose, UA: NEGATIVE mg/dL
Leukocytes, UA: NEGATIVE
Nitrite: NEGATIVE
Specific Gravity, Urine: 1.014 (ref 1.005–1.030)
pH: 5 (ref 5.0–8.0)

## 2011-11-24 NOTE — ED Provider Notes (Signed)
History     CSN: [id number]  Arrival date & time 11/24/11  1136   First MD Initiated Contact with Patient 11/24/11 1207      Chief Complaint  Patient presents with  . Abdominal Pain     Patient is a 41 y.o. male presenting with abdominal pain. The history is provided by the patient.  Abdominal Pain The primary symptoms of the illness include abdominal pain. The primary symptoms of the illness do not include fever, fatigue, nausea, vomiting, diarrhea or dysuria. The current episode started more than 2 days ago. The onset of the illness was gradual. The problem has not changed since onset. The patient has not had a change in bowel habit. Symptoms associated with the illness do not include chills, urgency, frequency or back pain.  Pt reports that he had recent cough/congestion.  He reports that after severe coughing he started to have abdominal pain.  It only seems to occur with deep palpation, coughing and lifting.  No palpable bulge reported.  No inguinal/groin pain/swelling.  No vomiting.  No constipation.  He otherwise feels at his baseline He reports h/o abdominal surgery years ago as child but none recent.  No diarrhea reported  He reports he does drink etoh and his wife told him he might have pancreatitis.  He has no h/o pancreatitis  Past Medical History  Diagnosis Date  . Prostatitis   . Asthma     Past Surgical History  Procedure Date  . Anus surgery     No family history on file.  History  Substance Use Topics  . Smoking status: Never Smoker   . Smokeless tobacco: Not on file  . Alcohol Use: Yes      Review of Systems  Constitutional: Negative for fever, chills and fatigue.  Gastrointestinal: Positive for abdominal pain. Negative for nausea, vomiting and diarrhea.  Genitourinary: Negative for dysuria, urgency and frequency.  Musculoskeletal: Negative for back pain.  All other systems reviewed and are negative.    Allergies  Aspirin  Home Medications     Current Outpatient Rx  Name Route Sig Dispense Refill  . ALBUTEROL SULFATE HFA 108 (90 BASE) MCG/ACT IN AERS Inhalation Inhale into the lungs every 6 (six) hours as needed. Patient used this medication for shortness of breath.      BP 146/88  Pulse 79  Temp 98.2 F (36.8 C) (Oral)  Resp 20  SpO2 99%  Physical Exam CONSTITUTIONAL: Well developed/well nourished HEAD AND FACE: Normocephalic/atraumatic EYES: EOMI/PERRL no icterus ENMT: Mucous membranes moist NECK: supple no meningeal signs SPINE:entire spine nontender CV: S1/S2 noted, no murmurs/rubs/gallops noted LUNGS: Lungs are clear to auscultation bilaterally, no apparent distress ABDOMEN: soft, nontender, no rebound or guarding. No hernia.  Mild point tenderness noted to just above the umbilicus. +BS No erythema or abscess noted. GU:no cva tenderness, no inguinal hernia noted.   NEURO: Pt is awake/alert, moves all extremitiesx4 EXTREMITIES: pulses normal, full ROM SKIN: warm, color normal PSYCH: no abnormalities of mood noted  ED Course  Procedures    Labs Reviewed  URINALYSIS, ROUTINE W REFLEX MICROSCOPIC     1. Abdominal pain    Advised need to cut back on ETOH, and we discussed strict return precautions   MDM  Nursing notes including past medical history and social history reviewed and considered in documentation Labs/vital reviewed and considered         Joya Gaskins, MD 11/24/11 1335

## 2011-11-24 NOTE — ED Notes (Signed)
Abdominal pain 2" to the left of his umbilicus. Pain started 3 weeks ago. Tenderness to touch at times pain gets sharp.

## 2011-11-24 NOTE — Discharge Instructions (Signed)

## 2012-01-09 ENCOUNTER — Emergency Department (HOSPITAL_BASED_OUTPATIENT_CLINIC_OR_DEPARTMENT_OTHER): Payer: Self-pay

## 2012-01-09 ENCOUNTER — Emergency Department (HOSPITAL_BASED_OUTPATIENT_CLINIC_OR_DEPARTMENT_OTHER)
Admission: EM | Admit: 2012-01-09 | Discharge: 2012-01-09 | Disposition: A | Discharge status: Home / Self Care | Payer: Self-pay | Attending: Emergency Medicine | Admitting: Emergency Medicine

## 2012-01-09 ENCOUNTER — Encounter (HOSPITAL_BASED_OUTPATIENT_CLINIC_OR_DEPARTMENT_OTHER): Payer: Self-pay | Admitting: *Deleted

## 2012-01-09 DIAGNOSIS — R062 Wheezing: Secondary | ICD-10-CM | POA: Insufficient documentation

## 2012-01-09 DIAGNOSIS — J45901 Unspecified asthma with (acute) exacerbation: Secondary | ICD-10-CM | POA: Insufficient documentation

## 2012-01-09 DIAGNOSIS — R0989 Other specified symptoms and signs involving the circulatory and respiratory systems: Secondary | ICD-10-CM | POA: Insufficient documentation

## 2012-01-09 DIAGNOSIS — Z79899 Other long term (current) drug therapy: Secondary | ICD-10-CM | POA: Insufficient documentation

## 2012-01-09 DIAGNOSIS — J40 Bronchitis, not specified as acute or chronic: Secondary | ICD-10-CM

## 2012-01-09 DIAGNOSIS — R0609 Other forms of dyspnea: Secondary | ICD-10-CM | POA: Insufficient documentation

## 2012-01-09 DIAGNOSIS — E669 Obesity, unspecified: Secondary | ICD-10-CM | POA: Insufficient documentation

## 2012-01-09 DIAGNOSIS — R0600 Dyspnea, unspecified: Secondary | ICD-10-CM

## 2012-01-09 DIAGNOSIS — Z87448 Personal history of other diseases of urinary system: Secondary | ICD-10-CM | POA: Insufficient documentation

## 2012-01-09 DIAGNOSIS — F411 Generalized anxiety disorder: Secondary | ICD-10-CM | POA: Insufficient documentation

## 2012-01-09 HISTORY — DX: Obesity, unspecified: E66.9

## 2012-01-09 LAB — BASIC METABOLIC PANEL WITH GFR
BUN: 19 mg/dL (ref 6–23)
Calcium: 9.6 mg/dL (ref 8.4–10.5)
GFR calc non Af Amer: >90 mL/min (ref >90)
Glucose, Bld: 90 mg/dL (ref 70–99)
Sodium: 138 meq/L (ref 135–145)

## 2012-01-09 LAB — CBC
HCT: 45.7 % (ref 39.0–52.0)
Hemoglobin: 15.6 g/dL (ref 13.0–17.0)
MCH: 31.8 pg (ref 26.0–34.0)
MCHC: 34.1 g/dL (ref 30.0–36.0)
MCV: 93.3 fL (ref 78.0–100.0)
Platelets: 227 10*3/uL (ref 150–400)
RBC: 4.9 MIL/uL (ref 4.22–5.81)
RDW: 13 % (ref 11.5–15.5)
WBC: 9 10*3/uL (ref 4.0–10.5)

## 2012-01-09 LAB — TROPONIN I: Troponin I: <0.3 ng/mL (ref <0.30)

## 2012-01-09 LAB — BASIC METABOLIC PANEL
CO2: 25 mEq/L (ref 19–32)
Chloride: 101 mEq/L (ref 96–112)
Creatinine, Ser: 0.9 mg/dL (ref 0.50–1.35)
GFR calc Af Amer: >90 mL/min (ref >90)
Potassium: 4 mEq/L (ref 3.5–5.1)

## 2012-01-09 LAB — PRO B NATRIURETIC PEPTIDE: Pro B Natriuretic peptide (BNP): 28.4 pg/mL (ref 0–125)

## 2012-01-09 MED ORDER — ALBUTEROL SULFATE HFA 108 (90 BASE) MCG/ACT IN AERS
1.0000 | INHALATION_SPRAY | Freq: Four times a day (QID) | RESPIRATORY_TRACT | Status: DC | PRN
  Administered 2012-01-09: 2 via RESPIRATORY_TRACT
  Filled 2012-01-09: qty 6.7

## 2012-01-09 MED ORDER — AZITHROMYCIN 250 MG PO TABS: 250.0000 mg | ORAL_TABLET | Freq: Every day | ORAL | Status: DC

## 2012-01-09 NOTE — ED Notes (Signed)
Pt has a feeling of "not getting enough air in when I breath." "I've gained about 80lbs in 6 months." Denies chest pain.

## 2012-01-09 NOTE — ED Notes (Signed)
Pt states that he has a hx of asthma "about 6 years ago" and has been [facility] for a few days. Wife states breathing funny at night. Denies other s/s. Increased wt gain "100 pounds in 6 months".

## 2012-01-09 NOTE — ED Notes (Signed)
RT Note: Patient states that he has been short of breath for the past 3 days and states he wakes up from his sleep at night trying to catch his breath. Stated that he has gained a lot of weight over the past 6 months. Patient is in no apparent acute distress currently.

## 2012-01-09 NOTE — ED Provider Notes (Signed)
History  This chart was scribed for Scott Larson. Scott Lamas, MD by Scott Larson, ED Scribe. This patient was seen in room MH12/MH12 and the patient's care was started at 1834.  CSN: [id number]  Arrival date & time 01/09/12  4098   First MD Initiated Contact with Patient 01/09/12 1834      Chief Complaint  Patient presents with  . Shortness of Breath     The history is provided by the patient. No language interpreter was used.    Scott Larson is a 41 y.o. male who presents to the Emergency Department complaining of SOB that started 3 days ago that has been gradually worsening. He states having episodes of losing breath easily. He denies recent long distance traveling. He reports gaining 100 pounds in the past 8 months to a year. After the weight gain he reports not being able to sleep well, and states his wife notices his breathing abilities decrease at night. He has a h/o prostatitis and asthma. He is an occasional alcohol user but denies smoking.Pt did lose about 100 lbs a few years ago, felt like he was never short of breath, didn't snore.  Now that he has gained weight, he is snoring again, breathing funny at night.  Pt reports getting on the Internet and reading about heart disease and he reports he has got himself worried.      Past Medical History  Diagnosis Date  . Prostatitis   . Asthma   . Obesity     Past Surgical History  Procedure Date  . Anus surgery     History reviewed. No pertinent family history.  History  Substance Use Topics  . Smoking status: Never Smoker   . Smokeless tobacco: Not on file  . Alcohol Use: Yes      Review of Systems  Constitutional: Negative for fever, chills and unexpected weight change.  Respiratory: Positive for shortness of breath. Negative for cough.   Cardiovascular: Negative for chest pain, palpitations and leg swelling.  Musculoskeletal: Negative for back pain.  Psychiatric/Behavioral: The patient is nervous/anxious.   All  other systems reviewed and are negative.    Allergies  Aspirin  Home Medications   Current Outpatient Rx  Name  Route  Sig  Dispense  Refill  . ALBUTEROL SULFATE HFA 108 (90 BASE) MCG/ACT IN AERS   Inhalation   Inhale into the lungs every 6 (six) hours as needed. Patient used this medication for shortness of breath.         . AZITHROMYCIN 250 MG PO TABS   Oral   Take 1 tablet (250 mg total) by mouth daily. 2 tablets by mouth on first day, then 1 tablet by mouth daily on days 2-5   6 tablet   0     Triage Vitals: BP 139/88  Pulse 60  Temp 97.7 F (36.5 C) (Oral)  Resp 24  Ht 6' (1.829 m)  Wt 360 lb (163.295 kg)  BMI 48.82 kg/m2  SpO2 95%  Physical Exam  Nursing note and vitals reviewed. Constitutional: He is oriented to person, place, and time. He appears well-developed and well-nourished. No distress.  HENT:  Head: Normocephalic and atraumatic.  Eyes: EOM are normal. Pupils are equal, round, and reactive to light.  Neck: Normal range of motion. Neck supple. No tracheal deviation present.  Cardiovascular: Normal rate, regular rhythm and normal heart sounds.   Pulmonary/Chest: Effort normal. No respiratory distress. He has wheezes.       Small wheeze  LUF  Abdominal: Soft. He exhibits no distension.  Musculoskeletal: Normal range of motion. He exhibits no edema.  Neurological: He is alert and oriented to person, place, and time.  Skin: Skin is warm and dry.  Psychiatric: His behavior is normal. His mood appears anxious. Cognition and memory are normal.    ED Course  Procedures (including critical care time)  DIAGNOSTIC STUDIES: Oxygen Saturation is 95% on room air, normal by my interpretation.    COORDINATION OF CARE:  6:45 PM Discussed treatment plan which includes blood work and an EKG with pt at bedside and pt agreed to plan.     Labs Reviewed  CBC  BASIC METABOLIC PANEL  PRO B NATRIURETIC PEPTIDE  TROPONIN I   Dg Chest 2 View  01/09/2012   *RADIOLOGY REPORT*  Clinical Data: Shortness of breath.  CHEST - 2 VIEW  Comparison: 01/24/2011  Findings: Focal opacity in the right lower lung may be consistent with early infiltrate or atelectasis.  Otherwise stable chronic lung disease without evidence of pulmonary edema.  No pleural fluid is seen.  Heart size is normal.  IMPRESSION: Right lower lung atelectasis versus early infiltrate.   Original Report Authenticated By: Irish Lack, M.D.      1. Dyspnea   2. Bronchitis      ECG at time 18:33 shows NSR at rate 62, normal axis, borderline LVH by voltage, no ST or T wave abn's.  No sig change from 01/24/11.    8:36 PM I reviewed CXR myself.  Per radiologist, RLL atelectasis or early infiltrate. Pt without much cough, but some wheeze and SOB, will treat with z pak.  Sats are normal here.   MDM  I personally performed the services described in this documentation, which was scribed in my presence. The recorded information has been reviewed and is accurate.   Pt is anxious. Overweight, atypical for cardiac.  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  Pt is reassured, BNP, troponin, ECG are all ok.  Will give inhaler, pt knows to exercise, lose weight, I will refer to a PCP.  Pt may benefit from a sleep study as outpt.       Scott Larson. Scott Lamas, MD 01/09/12 2037

## 2012-01-09 NOTE — Discharge Instructions (Signed)
   Dyspnea Shortness of breath (dyspnea) is the feeling of uneasy breathing. Dyspnea should be evaluated promptly. DIAGNOSIS  Many tests may be done to find why you are having shortness of breath. Tests may include:  A chest X-ray.   A lung function test.   Blood tests.   Recordings of the electrical activity of the heart (electrocardiogram).   Exercise testing.   Sound wave images of the heart (a cardiac echocardiogram).   A scan.  A cause for your shortness of breath may not be identified initially. In this case, it is important to have a follow-up exam with your caregiver. HOME CARE INSTRUCTIONS   Do not smoke. Smoking is a common cause of shortness of breath. Ask for help to stop smoking.   Avoid being around chemicals that may bother your breathing, such as paint fumes or dust.   Rest as needed. Slowly begin your usual activities.   If medications were prescribed, take them as directed for the full length of time directed. This includes oxygen and any inhaled medications, if prescribed.   It is very important that you follow up with your caregiver or other physician as directed. Waiting to do so or failure to follow up could result in worsening of your condition, possible disability, or death.   Be sure you understand what to do or who to call if your shortness of breath worsens.  SEEK MEDICAL CARE IF:   Your condition does not improve in the time expected.   You have a hard time doing your normal activities even with rest.   You have any side effects from or problems with medications prescribed.  SEEK IMMEDIATE MEDICAL CARE IF:   You feel your shortness of breath is getting worse.   You feel lightheaded, faint or develop a cough not controlled with medications.   You start coughing up blood.   You get pain with breathing.   You get chest pain or pain in your arms, shoulders or belly (abdomen).   You have a fever.   You are unable to walk up stairs or  exercise the way you normally can.  MAKE SURE YOU:   Understand these instructions.   Will watch your condition.   Will get help right away if you are not doing well or get worse.  Document Released: 03/05/2004 Document Revised: 10/08/2010 Document Reviewed: 06/13/2009 [facility] Patient Information 2012 [place], [place].

## 2013-03-15 ENCOUNTER — Encounter: Payer: Self-pay | Admitting: Podiatry

## 2013-03-15 ENCOUNTER — Ambulatory Visit (INDEPENDENT_AMBULATORY_CARE_PROVIDER_SITE_OTHER): Payer: Self-pay | Admitting: Podiatry

## 2013-03-15 ENCOUNTER — Ambulatory Visit (INDEPENDENT_AMBULATORY_CARE_PROVIDER_SITE_OTHER): Payer: Self-pay

## 2013-03-15 VITALS — BP 133/83 | HR 66 | Resp 18 | Ht 73.0 in | Wt 320.0 lb

## 2013-03-15 DIAGNOSIS — M722 Plantar fascial fibromatosis: Secondary | ICD-10-CM

## 2013-03-15 DIAGNOSIS — M79609 Pain in unspecified limb: Secondary | ICD-10-CM

## 2013-03-15 MED ORDER — MELOXICAM 15 MG PO TABS: 15.0000 mg | ORAL_TABLET | Freq: Every day | ORAL | Status: DC

## 2013-03-15 MED ORDER — TRIAMCINOLONE ACETONIDE 10 MG/ML IJ SUSP
10.0000 mg | Freq: Once | INTRAMUSCULAR | Status: AC
  Administered 2013-03-15: 10 mg

## 2013-03-15 NOTE — Progress Notes (Signed)
   Subjective:    Patient ID: Scott Larson, male    DOB: [birth year], 43 y.o.   MRN: [id number]  HPI N heel pain        L right medial and lateral arch         D 6 weeks        O         C stabbing pain, dull ache        A worse after rest and am        T urgent care stated had arthritis    Review of Systems  Psychiatric/Behavioral: Positive for confusion. The patient is nervous/anxious.   All other systems reviewed and are negative.       Objective:   Physical Exam        Assessment & Plan:

## 2013-03-15 NOTE — Patient Instructions (Signed)

## 2013-03-16 NOTE — Progress Notes (Signed)
Subjective:     Patient ID: Scott Larson, male   DOB: 15-Apr-1970, 43 y.o.   MRN: [id number]  HPI patient states that I'm having pain in my right heel that has been present for several months it has worsened over the last 6 weeks. Mostly on the bottom but some into the arch and the back  Review of Systems  All other systems reviewed and are negative.       Objective:   Physical Exam  Constitutional: He is oriented to person, place, and time.  Cardiovascular: Intact distal pulses.   Musculoskeletal: Normal range of motion.  Neurological: He is oriented to person, place, and time.  Skin: Skin is warm.   neurovascular status intact with muscle strength adequate and no equinus condition noted. Discomfort plantar heel at the insertional point of the tendon into the calcaneus with inflammation and fluid around the medial band     Assessment:     Plantar fasciitis right with inflammation and fluid at the insertion    Plan:     H&P performed and x-ray reviewed. Injected the plantar fascia 3 mg Kenalog 5 of Xylocaine Marcaine mixture and dispensed fascial brace with instructions. Placed on Mobic 15 mg daily

## 2013-03-29 ENCOUNTER — Encounter: Payer: Self-pay | Admitting: Podiatry

## 2013-03-29 ENCOUNTER — Ambulatory Visit (INDEPENDENT_AMBULATORY_CARE_PROVIDER_SITE_OTHER): Payer: Self-pay | Admitting: Podiatry

## 2013-03-29 VITALS — BP 137/96 | HR 66 | Resp 12

## 2013-03-29 DIAGNOSIS — M722 Plantar fascial fibromatosis: Secondary | ICD-10-CM

## 2013-03-29 NOTE — Progress Notes (Signed)
Subjective:     Patient ID: Scott Larson, male   DOB: [birth year], 43 y.o.   MRN: [id number]  HPI patient states I'm doing better with my heels beginning a little bit of pain in the forefoot left foot. He'll still bothering but not to the same intensity   Review of Systems     Objective:   Physical Exam Neurovascular status intact with mild discomfort plantar heel region both feet and mild discomfort in the metatarsal phalangeal joints left    Assessment:     Mild plantar fasciitis still noted along with capsulitis left    Plan:     Reviewed conditions and discussed physical therapy to do at home. Scanned for custom orthotics to reduce stress against the feet and dispensed metatarsal pads for foot

## 2013-05-31 ENCOUNTER — Emergency Department (HOSPITAL_BASED_OUTPATIENT_CLINIC_OR_DEPARTMENT_OTHER): Payer: Self-pay

## 2013-05-31 ENCOUNTER — Emergency Department (HOSPITAL_BASED_OUTPATIENT_CLINIC_OR_DEPARTMENT_OTHER)
Admission: EM | Admit: 2013-05-31 | Discharge: 2013-05-31 | Disposition: A | Discharge status: Home / Self Care | Payer: Self-pay | Attending: Emergency Medicine | Admitting: Emergency Medicine

## 2013-05-31 ENCOUNTER — Encounter (HOSPITAL_BASED_OUTPATIENT_CLINIC_OR_DEPARTMENT_OTHER): Payer: Self-pay | Admitting: Emergency Medicine

## 2013-05-31 DIAGNOSIS — M79603 Pain in arm, unspecified: Secondary | ICD-10-CM

## 2013-05-31 DIAGNOSIS — M79609 Pain in unspecified limb: Secondary | ICD-10-CM | POA: Insufficient documentation

## 2013-05-31 DIAGNOSIS — R209 Unspecified disturbances of skin sensation: Secondary | ICD-10-CM | POA: Insufficient documentation

## 2013-05-31 DIAGNOSIS — E669 Obesity, unspecified: Secondary | ICD-10-CM | POA: Insufficient documentation

## 2013-05-31 DIAGNOSIS — R079 Chest pain, unspecified: Secondary | ICD-10-CM | POA: Insufficient documentation

## 2013-05-31 DIAGNOSIS — J45901 Unspecified asthma with (acute) exacerbation: Secondary | ICD-10-CM | POA: Insufficient documentation

## 2013-05-31 DIAGNOSIS — Z7982 Long term (current) use of aspirin: Secondary | ICD-10-CM | POA: Insufficient documentation

## 2013-05-31 DIAGNOSIS — R0602 Shortness of breath: Secondary | ICD-10-CM

## 2013-05-31 DIAGNOSIS — F411 Generalized anxiety disorder: Secondary | ICD-10-CM | POA: Insufficient documentation

## 2013-05-31 LAB — BASIC METABOLIC PANEL
BUN: 22 mg/dL (ref 6–23)
CALCIUM: 9.4 mg/dL (ref 8.4–10.5)
CO2: 26 mEq/L (ref 19–32)
Chloride: 101 mEq/L (ref 96–112)
Creatinine, Ser: 0.9 mg/dL (ref 0.50–1.35)
GFR calc Af Amer: >90 mL/min (ref >90)
GLUCOSE: 88 mg/dL (ref 70–99)
Potassium: 4.2 mEq/L (ref 3.7–5.3)
Sodium: 139 mEq/L (ref 137–147)

## 2013-05-31 LAB — CBC
HEMATOCRIT: 46.8 % (ref 39.0–52.0)
HEMOGLOBIN: 15.8 g/dL (ref 13.0–17.0)
MCH: 32.3 pg (ref 26.0–34.0)
MCHC: 33.8 g/dL (ref 30.0–36.0)
MCV: 95.7 fL (ref 78.0–100.0)
Platelets: 232 10*3/uL (ref 150–400)
RBC: 4.89 MIL/uL (ref 4.22–5.81)
RDW: 13.7 % (ref 11.5–15.5)
WBC: 7.3 10*3/uL (ref 4.0–10.5)

## 2013-05-31 LAB — TROPONIN I
Troponin I: <0.3 ng/mL (ref <0.30)
Troponin I: <0.3 ng/mL (ref <0.30)

## 2013-05-31 MED ORDER — ALBUTEROL SULFATE (2.5 MG/3ML) 0.083% IN NEBU
5.0000 mg | INHALATION_SOLUTION | Freq: Once | RESPIRATORY_TRACT | Status: AC
  Administered 2013-05-31: 5 mg via RESPIRATORY_TRACT
  Filled 2013-05-31: qty 6

## 2013-05-31 MED ORDER — ALBUTEROL SULFATE HFA 108 (90 BASE) MCG/ACT IN AERS
2.0000 | INHALATION_SPRAY | Freq: Once | RESPIRATORY_TRACT | Status: AC
  Administered 2013-05-31: 2 via RESPIRATORY_TRACT
  Filled 2013-05-31: qty 6.7

## 2013-05-31 NOTE — ED Notes (Signed)
Pt having some shortness of breath and some left arm pain for last three days.  Pt states he feels like he has cold fingers.

## 2013-05-31 NOTE — Discharge Instructions (Signed)
Use albuterol inhaler 2 puffs every 4-6 hours Take ibuprofen as needed for chest wall pain or arm pain - 600-800 mg every 6-8 hours  Return to the emergency department if you develop any changing/worsening condition, fever, coughing up blood, difficulty breathing not relieved by your inhaler, change or worsening chest pain, leg swelling, or any other concerns (please read additional information regarding your condition below)     Chest Pain (Nonspecific) It is often hard to give a specific diagnosis for the cause of chest pain. There is always a chance that your pain could be related to something serious, such as a heart attack or a blood clot in the lungs. You need to follow up with your caregiver for further evaluation. CAUSES   Heartburn.  Pneumonia or bronchitis.  Anxiety or stress.  Inflammation around your heart (pericarditis) or lung (pleuritis or pleurisy).  A blood clot in the lung.  A collapsed lung (pneumothorax). It can develop suddenly on its own (spontaneous pneumothorax) or from injury (trauma) to the chest.  Shingles infection (herpes zoster virus). The chest wall is composed of bones, muscles, and cartilage. Any of these can be the source of the pain.  The bones can be bruised by injury.  The muscles or cartilage can be strained by coughing or overwork.  The cartilage can be affected by inflammation and become sore (costochondritis). DIAGNOSIS  Lab tests or other studies, such as X-rays, electrocardiography, stress testing, or cardiac imaging, may be needed to find the cause of your pain.  TREATMENT   Treatment depends on what may be causing your chest pain. Treatment may include:  Acid blockers for heartburn.  Anti-inflammatory medicine.  Pain medicine for inflammatory conditions.  Antibiotics if an infection is present.  You may be advised to change lifestyle habits. This includes stopping smoking and avoiding alcohol, caffeine, and chocolate.  You  may be advised to keep your head raised (elevated) when sleeping. This reduces the chance of acid going backward from your stomach into your esophagus.  Most of the time, nonspecific chest pain will improve within 2 to 3 days with rest and mild pain medicine. HOME CARE INSTRUCTIONS   If antibiotics were prescribed, take your antibiotics as directed. Finish them even if you start to feel better.  For the next few days, avoid physical activities that bring on chest pain. Continue physical activities as directed.  Do not smoke.  Avoid drinking alcohol.  Only take over-the-counter or prescription medicine for pain, discomfort, or fever as directed by your caregiver.  Follow your caregiver's suggestions for further testing if your chest pain does not go away.  Keep any follow-up appointments you made. If you do not go to an appointment, you could develop lasting (chronic) problems with pain. If there is any problem keeping an appointment, you must call to reschedule. SEEK MEDICAL CARE IF:   You think you are having problems from the medicine you are taking. Read your medicine instructions carefully.  Your chest pain does not go away, even after treatment.  You develop a rash with blisters on your chest. SEEK IMMEDIATE MEDICAL CARE IF:   You have increased chest pain or pain that spreads to your arm, neck, jaw, back, or abdomen.  You develop shortness of breath, an increasing cough, or you are coughing up blood.  You have severe back or abdominal pain, feel nauseous, or vomit.  You develop severe weakness, fainting, or chills.  You have a fever. THIS IS AN EMERGENCY. Do not  wait to see if the pain will go away. Get medical help at once. Call your local emergency services (911 in U.[place].). Do not drive yourself to the hospital. MAKE SURE YOU:   Understand these instructions.  Will watch your condition.  Will get help right away if you are not doing well or get worse. Document  Released: 11/05/2004 Document Revised: 04/20/2011 Document Reviewed: 09/01/2007 [facility] Patient Information 2014 [place], [place].  Shortness of Breath Shortness of breath means you have trouble breathing. Shortness of breath may indicate that you have a medical problem. You should seek immediate medical care for shortness of breath. CAUSES   Not enough oxygen in the air (as with high altitudes or a smoke-filled room).  Short-term (acute) lung disease, including:  Infections, such as pneumonia.  Fluid in the lungs, such as heart failure.  A blood clot in the lungs (pulmonary embolism).  Long-term (chronic) lung diseases.  Heart disease (heart attack, angina, heart failure, and others).  Low red blood cells (anemia).  Poor physical fitness. This can cause shortness of breath when you exercise.  Chest or back injuries or stiffness.  Being overweight.  Smoking.  Anxiety. This can make you feel like you are not getting enough air. DIAGNOSIS  Serious medical problems can usually be found during your physical exam. Tests may also be done to determine why you are having shortness of breath. Tests may include:  Chest X-rays.  Lung function tests.  Blood tests.  Electrocardiography.  Exercise testing.  Echocardiography.  Imaging scans. Your caregiver may not be able to find a cause for your shortness of breath after your exam. In this case, it is important to have a follow-up exam with your caregiver as directed.  TREATMENT  Treatment for shortness of breath depends on the cause of your symptoms and can vary greatly. HOME CARE INSTRUCTIONS   Do not smoke. Smoking is a common cause of shortness of breath. If you smoke, ask for help to quit.  Avoid being around chemicals or things that may bother your breathing, such as paint fumes and dust.  Rest as needed. Slowly resume your usual activities.  If medicines were prescribed, take them as directed for the full length of  time directed. This includes oxygen and any inhaled medicines.  Keep all follow-up appointments as directed by your caregiver. SEEK MEDICAL CARE IF:   Your condition does not improve in the time expected.  You have a hard time doing your normal activities even with rest.  You have any side effects or problems with the medicines prescribed.  You develop any new symptoms. SEEK IMMEDIATE MEDICAL CARE IF:   Your shortness of breath gets worse.  You feel lightheaded, faint, or develop a cough not controlled with medicines.  You start coughing up blood.  You have pain with breathing.  You have chest pain or pain in your arms, shoulders, or abdomen.  You have a fever.  You are unable to walk up stairs or exercise the way you normally do. MAKE SURE YOU:  Understand these instructions.  Will watch your condition.  Will get help right away if you are not doing well or get worse. Document Released: 10/21/2000 Document Revised: 07/28/2011 Document Reviewed: 04/13/2011 [facility] Patient Information 2014 [place], [place].  Musculoskeletal Pain Musculoskeletal pain is muscle and boney aches and pains. These pains can occur in any part of the body. Your caregiver may treat you without knowing the cause of the pain. They may treat you if blood  or urine tests, X-rays, and other tests were normal.  CAUSES There is often not a definite cause or reason for these pains. These pains may be caused by a type of germ (virus). The discomfort may also come from overuse. Overuse includes working out too hard when your body is not fit. Boney aches also come from weather changes. Bone is sensitive to atmospheric pressure changes. HOME CARE INSTRUCTIONS   Ask when your test results will be ready. Make sure you get your test results.  Only take over-the-counter or prescription medicines for pain, discomfort, or fever as directed by your caregiver. If you were given medications for your condition, do not  drive, operate machinery or power tools, or sign legal documents for 24 hours. Do not drink alcohol. Do not take sleeping pills or other medications that may interfere with treatment.  Continue all activities unless the activities cause more pain. When the pain lessens, slowly resume normal activities. Gradually increase the intensity and duration of the activities or exercise.  During periods of severe pain, bed rest may be helpful. Lay or sit in any position that is comfortable.  Putting ice on the injured area.  Put ice in a bag.  Place a towel between your skin and the bag.  Leave the ice on for 15 to 20 minutes, 3 to 4 times a day.  Follow up with your caregiver for continued problems and no reason can be found for the pain. If the pain becomes worse or does not go away, it may be necessary to repeat tests or do additional testing. Your caregiver may need to look further for a possible cause. SEEK IMMEDIATE MEDICAL CARE IF:  You have pain that is getting worse and is not relieved by medications.  You develop chest pain that is associated with shortness or breath, sweating, feeling sick to your stomach (nauseous), or throw up (vomit).  Your pain becomes localized to the abdomen.  You develop any new symptoms that seem different or that concern you. MAKE SURE YOU:   Understand these instructions.  Will watch your condition.  Will get help right away if you are not doing well or get worse. Document Released: 01/26/2005 Document Revised: 04/20/2011 Document Reviewed: 09/30/2012 [facility] Patient Information 2014 [place], [place].  RICE: Routine Care for Injuries The routine care of many injuries includes Rest, Ice, Compression, and Elevation (RICE). HOME CARE INSTRUCTIONS  Rest is needed to allow your body to heal. Routine activities can usually be resumed when comfortable. Injured tendons and bones can take up to 6 weeks to heal. Tendons are the cord-like structures that attach  muscle to bone.  Ice following an injury helps keep the swelling down and reduces pain.  Put ice in a plastic bag.  Place a towel between your skin and the bag.  Leave the ice on for 15-20 minutes, 03-04 times a day. Do this while awake, for the first 24 to 48 hours. After that, continue as directed by your caregiver.  Compression helps keep swelling down. It also gives support and helps with discomfort. If an elastic bandage has been applied, it should be removed and reapplied every 3 to 4 hours. It should not be applied tightly, but firmly enough to keep swelling down. Watch fingers or toes for swelling, bluish discoloration, coldness, numbness, or excessive pain. If any of these problems occur, remove the bandage and reapply loosely. Contact your caregiver if these problems continue.  Elevation helps reduce swelling and decreases pain. With extremities,  such as the arms, hands, legs, and feet, the injured area should be placed near or above the level of the heart, if possible. SEEK IMMEDIATE MEDICAL CARE IF:  You have persistent pain and swelling.  You develop redness, numbness, or unexpected weakness.  Your symptoms are getting worse rather than improving after several days. These symptoms may indicate that further evaluation or further X-rays are needed. Sometimes, X-rays may not show a small broken bone (fracture) until 1 week or 10 days later. Make a follow-up appointment with your caregiver. Ask when your X-ray results will be ready. Make sure you get your X-ray results. Document Released: 05/10/2000 Document Revised: 04/20/2011 Document Reviewed: 06/27/2010 [facility] Patient Information 2014 [place], [place].   Emergency Department Resource Guide 1) Find a Doctor and Pay Out of Pocket Although you won't have to find out who is covered by your insurance plan, it is a good idea to ask around and get recommendations. You will then need to call the office and see if the doctor you have  chosen will accept you as a new patient and what types of options they offer for patients who are self-pay. Some doctors offer discounts or will set up payment plans for their patients who do not have insurance, but you will need to ask so you aren't surprised when you get to your appointment.  2) Contact Your Local Health Department Not all health departments have doctors that can see patients for sick visits, but many do, so it is worth a call to see if yours does. If you don't know where your local health department is, you can check in your phone book. The CDC also has a tool to help you locate your state's health department, and many state websites also have listings of all of their local health departments.  3) Find a Walk-in Clinic If your illness is not likely to be very severe or complicated, you may want to try a walk in clinic. These are popping up all over the country in pharmacies, drugstores, and shopping centers. They're usually staffed by nurse practitioners or physician assistants that have been trained to treat common illnesses and complaints. They're usually fairly quick and inexpensive. However, if you have serious medical issues or chronic medical problems, these are probably not your best option.  No Primary Care Doctor: - Call Health Connect at  [phone] - they can help you locate a primary care doctor that  accepts your insurance, provides certain services, etc. - Physician Referral Service- [phone]  Chronic Pain Problems: Organization         Address  Phone   Notes  [facility]  [phone] Patients need to be referred by their primary care doctor.   Medication Assistance: Organization         Address  Phone   Notes  [facility] [address] [place], [place] [address] [phone] --Must be a resident of [place] [facility] [place] -- Must have NO insurance coverage whatsoever (no Medicaid/ Medicare,  etc.) -- The pt. MUST have a primary care doctor that directs their care regularly and follows them in the community   Med[facility]ist  [phone]   [employer]  [phone]    Agencies that provide inexpensive medical care: Organization         Address  Phone   Notes  [facility]  [phone]   [facility]    [phone]  [facility] [address] [place], [place] [address] [phone]   [facility] [address], [place] [phone]   [facility]    [phone]   [facility]    [phone]   [facility]  [address], [place] Phone:  [phone], Fax:  [phone] Hours of Operation:  9 am - 6 pm, M-F.  Also accepts Medicaid/Medicare and self-pay.  [facility]  [address], [place] Phone: [phone], Fax: [phone]. Hours of Operation:  8:30 am - 5:30 pm, M-F.  Also accepts Medicaid and self-pay.  [facility] [place] [address], [place] Phone: [phone]   [facility] [address] [place]asha [place], [place] [phone], Ext. [phone] Mondays & Thursdays: 7-9 AM.  First 15 patients are seen on a first come, first serve basis.    Medicaid-accepting [facility] Providers:  Organization         Address  Phone   Notes  [facility] [address], [place] [phone] Also accepts self-pay patients.  [facility] [address] Laurell Josephs [place], [place]  [phone]   [facility] [address], [place] [phone]   [facility] [address], [place] [phone]   Renaye Rakers [address], [place]   [phone] Only accepts [place] Access [place] patients after they have their name applied to their card.   Self-Pay (no  insurance) in [facility]:  Organization         Address  Phone   Notes  Sickle Cell Patients, [facility] [address] [place], [place] [phone]   [facility] [address] [place], [place] [phone]   [facility] [place]  [address], [place] [phone]   [facility]/Dr. Osei-Bonsu  [address] [place] or [address], [place] [phone] Phone number for both [place] and [place] locations is the same.  [facility] [address], [place] [phone]   [facility] [address], [place] or [address] [phone]   [facility] [address], [place] [phone], phone; [phone], fax Sees patients 1st and 3rd Saturday of every month.  Must not qualify for public or private insurance (i.e. Medicaid, Medicare, [place] Health Choice, Veterans' Benefits)  Household income should be no more than 200% of the poverty level The clinic cannot treat you if you are pregnant or think you are pregnant  Sexually transmitted diseases are not treated at the clinic.    Dental Care: Organization         Address  Phone  Notes  [facility] Department of [facility] [address] [place], [place] [phone] Accepts children up to age 3 who are enrolled in [place] or [place] Health Choice; pregnant women with a Medicaid card; and children who have applied for Medicaid or [place] Health Choice, but were declined, whose parents can pay a reduced fee at time of service.  [facility]  [address], [place] [phone] Accepts children up to age 82 who are  enrolled in Medicaid or [place] Health Choice; pregnant women with a Medicaid card; and children who have applied for Medicaid or [place] Health Choice, but were  declined, whose parents can pay a reduced fee at time of service.  [facility]  [address], [place] [phone] Patients are seen by appointment only. Walk-ins are not accepted. [facility] will see patients 118 years of age and older. Monday - Tuesday (8am-5pm) Most Wednesdays (8:30-5pm) $30 per visit, cash only  [facility]  [address], [facility] [phone] Patients are seen by appointment only. Walk-ins are not accepted. [facility] will see patients 45 years of age and older. One Wednesday Evening (Monthly: Volunteer Based).  $30 per visit, cash only  [employer] [facility] [facility]  [phone] for adults; Children under age 194, call Graduate Pediatric Dentistry at [phone]. Children aged 14-14, please call [phone] to request a pediatric application.  Dental services are provided in all areas of dental care including fillings, crowns and bridges, complete and partial dentures, implants, gum treatment, root canals, and extractions. Preventive care is also provided. Treatment is provided to both adults and children. Patients are selected via a lottery and there is often a waiting list.   [facility] [address], [place]  [phone] [email or web address]   [facility] [address], [place], [place] [phone], Ext. [phone] Second and Fourth Thursday of each month, opens at 6:30 AM; Clinic ends at 9 AM.  Patients are seen on a first-come first-served basis, and a limited number are seen during each clinic.   [facility]  [address], [place], [place] [phone]   Eligibility Requirements You must have lived in [place], or [place] counties for at least the last three months.   You cannot be eligible for state or federal sponsored [place] Cityhealthcare insurance, including CIGNAVeterans Administration, [place]icaid, or [employer]Medicare.   You generally cannot be  eligible for healthcare insurance through your employer.    How to apply: Eligibility screenings are held every Tuesday and Wednesday afternoon from 1:00 pm until 4:00 pm. You do not need an appointment for the interview!  [facility] [address], [place], [place] [phone]   [facility]  [phone]   [facility]  [phone]   [facility]  [phone]    Behavioral Health Resources in the Community: Intensive Outpatient Programs Organization         Address  Phone  Notes  [facility] [address], [place], [place] [phone]   [facility] [address], [place], [place] [phone]   [facility] [address], [place], [place]  [phone]   [facility] [address],  [place], [place] [phone] or [phone]   Substance Abuse Resources Organization         Address  Phone  Notes  [facility]  [phone]   [facility]  [phone]   [facility]  [phone]   [facility]  [phone]   Residential & Outpatient Substance Abuse Program  [phone]   Psychological Services Organization         Address  Phone  Notes  [facility]  [phone]   [facility]  [phone]- [phone]   [facility] [address], [place]  [phone] or [phone]    Mobile Crisis Teams Organization         Address  Phone  Notes  [facility], Mobile Crisis Care Unit  [phone]   [facility]  [address] [place], [place] [phone]   [facility] [address] [place] [phone]    Self-Help/Support Groups Organization         Address  Phone             Notes  [facility] [place] - variety of support groups  [phone]- [phone] Call for more information    Narcotics Anonymous (NA), [facility] [address], [employer] [place]  2 meetings at this location   Statistician         Address  Phone  Notes  [facility] [address],    [place]  [phone]   [facility]  [address] [place] [address], [place], [place] [phone]   [facility] [address] [place], [place] [phone] Admissions: 8am-3pm M-F  [facility] [address],    [place], [place] [phone]   [facility] [address] [place], [place], [place] [phone]   [facility] [address]  [place], [place] [phone]   [facility] - Intensive Outpatient [address], [place], [place] [phone]   [facility] ([facility] [address] [place]  [place], [place] [phone] or [phone]   [facility] [address], [place], [place] [phone] Accepts Medicaid  [facility] [address]  [place] [phone] Substance Abuse/Addiction Treatment   [facility]         Address  Phone  Notes  [facility]  [phone]   Angie Fava, PhD [address] [place], [place]   [phone] or [phone]   [facility]   [address] [place], [place] [phone]   [facility] [address] [place], [place] [phone] Insurance/Medicaid/sponsorship through [facility] [address]                                    [place], [place] [phone] Therapy/tele-psych/case  [facility] [address], [place] [phone]    Dr. Lolly Mustache  [phone]   [facility]  [employer] [facility] Dept. 1) [address], [place] 2) [address], [place] 3)  [address], [place] [phone]  [phone]   [facility] Child Abuse Hotline [phone] or [phone] (After Hours)

## 2013-05-31 NOTE — ED Provider Notes (Signed)
CSN: [id number]     Arrival date & time 05/31/13  1238 History   First MD Initiated Contact with Patient 05/31/13 1300     Chief Complaint  Patient presents with  . Shortness of Breath  . Arm Pain   HPI  Scott Larson is a 43 y.o. male with a PMH of asthma and prostatitis who presents to the ED for evaluation of SOB and arm pain. History was provided by the patient. Patient states he's had shortness of breath over the past 3-5 days. Patient states his shortness of breath is intermittent and is not with exertion. He states he has had mild intermittent wheezing. He used his wife's albuterol inhaler with no relief in his symptoms. Patient also states he has had a few episodes of chest pain on the left side of his chest no radiation over the past 3 days. Patient describes this as a "twinge", which is worse with movement and palpation. He denies any chest pain currently. His last episode of chest pain was a few hours prior to arrival. No cough. Patient also states he's had a "nerve pain" in his left inner upper arm over the past 3-5 days. He states that he is also have this is right arm but is more pronounced on the left side. 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 He also has had a "tingling" in his left hand which is worse in the morning. He denies any weakness, loss of sensation, swelling, or redness. He states that he has a slight altered sensation currently. Patient also has had nasal congestion over the past 3 days. He denies any sore throat, ear pain, fever, chills, fatigue, or change in appetite or activity. He states he's had a 20 pound weight gain over the past few months. He states he's been very stressed and is not watching his diet. He denies any leg swelling. No history of DVT, PE, clotting disorder, cancer, recent surgery, immobilization, travel.    Past Medical History  Diagnosis Date  . Prostatitis   . Asthma   . Obesity    Past  Surgical History  Procedure Laterality Date  . Anus surgery     History reviewed. No pertinent family history. History  Substance Use Topics  . Smoking status: Never Smoker   . Smokeless tobacco: Not on file  . Alcohol Use: Yes    Review of Systems  Constitutional: Positive for unexpected weight change. Negative for fever, chills, diaphoresis, activity change, appetite change and fatigue.  HENT: Positive for congestion. Negative for ear pain, rhinorrhea, sinus pressure and sore throat.   Respiratory: Positive for shortness of breath and wheezing. Negative for cough.   Cardiovascular: Positive for chest pain. Negative for palpitations and leg swelling.  Gastrointestinal: Negative for nausea, vomiting, abdominal pain, diarrhea and constipation.  Genitourinary: Negative for dysuria.  Musculoskeletal: Negative for back pain, myalgias and neck pain.  Neurological: Positive for numbness. Negative for dizziness, syncope, weakness, light-headedness and headaches.  Psychiatric/Behavioral: The patient is nervous/anxious.     Allergies  Aspirin  Home Medications   Prior to Admission medications   Medication Sig Start Date End Date Taking? Authorizing Provider  aspirin 81 MG tablet Take 81 mg by mouth daily.   Yes Historical Provider, MD  MULTIPLE VITAMIN PO Take by mouth.    Historical Provider, MD   BP 140/92  Pulse 52  Temp(Src) 97.5 F (36.4 C) (Oral)  Resp 22  Ht  6' (1.829 m)  Wt 345 lb (156.491 kg)  BMI 46.78 kg/m2  SpO2 100%  Filed Vitals:   05/31/13 1411 05/31/13 1538 05/31/13 1538 05/31/13 1600  BP:   126/70 107/58  Pulse:   48 45  Temp:      TempSrc:      Resp:   20   Height:      Weight:      SpO2: 100% 98% 98% 98%    Physical Exam  Nursing note and vitals reviewed. Constitutional: He is oriented to person, place, and time. He appears well-developed and well-nourished. No distress.  Anxious appearing  HENT:  Head: Normocephalic and atraumatic.  Right Ear:  External ear normal.  Left Ear: External ear normal.  Nose: Nose normal.  Mouth/Throat: Oropharynx is clear and moist. No oropharyngeal exudate.  Nasal congestion. Tympanic membranes gray and translucent bilaterally with no erythema, edema, or hemotympanum.  No mastoid or tragal tenderness bilaterally.   Eyes: Conjunctivae are normal. Right eye exhibits no discharge. Left eye exhibits no discharge.  Neck: Normal range of motion. Neck supple.  Cardiovascular: Normal rate, regular rhythm, normal heart sounds and intact distal pulses.  Exam reveals no gallop and no friction rub.   No murmur heard. Radial and dorsalis pedis pulses present and equal bilaterally  Pulmonary/Chest: Effort normal. No respiratory distress. He has wheezes. He has no rales. He exhibits tenderness.  Scattered expiratory wheeze. Decreased breath sounds throughout. Diffuse anterior-superior left sided tenderness to palpation.   Abdominal: Soft. He exhibits no distension and no mass. There is no tenderness. There is no rebound and no guarding.  Musculoskeletal: Normal range of motion. He exhibits no edema and no tenderness.  No LE edema or calf tenderness bilaterally. No tenderness to palpation to the UE throughout. Strength 5/5 in the upper extremities bilaterally.    Neurological: He is alert and oriented to person, place, and time.  Sensation intact in the UE throughout.   Skin: Skin is warm and dry. He is not diaphoretic.     ED Course  Procedures (including critical care time) Labs Review Labs Reviewed  TROPONIN I  CBC  BASIC METABOLIC PANEL    Imaging Review Dg Chest 2 View  05/31/2013   CLINICAL DATA:  SHORTNESS OF BREATH ARM PAIN  EXAM: CHEST  2 VIEW  COMPARISON:  DG CHEST 2 VIEW dated 01/09/2012  FINDINGS: Low lung volumes. Cardiac silhouette within the upper limits normal. Lungs are clear. Osseous structures unremarkable.  IMPRESSION: No active cardiopulmonary disease.   Electronically Signed   By: Salome Holmes M.D.   On: 05/31/2013 13:47     EKG Interpretation   Date/Time:  Wednesday May 31 2013 12:53:21 EDT Ventricular Rate:  44 PR Interval:  154 QRS Duration: 90 QT Interval:  440 QTC Calculation: 376 R Axis:   33 Text Interpretation:  Marked sinus bradycardia Septal infarct , age  undetermined Abnormal ECG No significant change since last tracing  Confirmed by [facility]  MD, CHRISTOPHER [phone]) on 05/31/2013 1:10:41 PM      Results for orders placed during the hospital encounter of 05/31/13  TROPONIN I      Result Value Ref Range   Troponin I <0.30  <0.30 ng/mL  CBC      Result Value Ref Range   WBC 7.3  4.0 - 10.5 K/uL   RBC 4.89  4.22 - 5.81 MIL/uL   Hemoglobin 15.8  13.0 - 17.0 g/dL   HCT 60.4  54.0 - 98.1 %  MCV 95.7  78.0 - 100.0 fL   MCH 32.3  26.0 - 34.0 pg   MCHC 33.8  30.0 - 36.0 g/dL   RDW 40.913.7  81.111.5 - 91.415.5 %   Platelets 232  150 - 400 K/uL  BASIC METABOLIC PANEL      Result Value Ref Range   Sodium 139  137 - 147 mEq/L   Potassium 4.2  3.7 - 5.3 mEq/L   Chloride 101  96 - 112 mEq/L   CO2 26  19 - 32 mEq/L   Glucose, Bld 88  70 - 99 mg/dL   BUN 22  6 - 23 mg/dL   Creatinine, Ser 7.820.90  0.50 - 1.35 mg/dL   Calcium 9.4  8.4 - 95.610.5 mg/dL   GFR calc non Af Amer >90  >90 mL/min   GFR calc Af Amer >90  >90 mL/min  TROPONIN I      Result Value Ref Range   Troponin I <0.30  <0.30 ng/mL     MDM   Scott Larson is a 43 y.o. male with a PMH of asthma and prostatitis who presents to the ED for evaluation of SOB and arm pain. Etiology of SOB possibly due to asthma. Patient had wheezing on exam which cleared with albuterol inhaler. Patient given inhaler in the ED to take home. PE unlikely. No risk factors. Vital signs stable. No hypoxia, respiratory distress, or tachypnea. Patient also has been experiencing intermittent episodes of chest pain. No chest pain throughout ED visit. Chest pain is reproducible. EKG negative for any acute ischemic changes. Chest x-ray  negative for an acute cardiopulmonary process. Troponin negative x 2. Patient's HEART score is low. Patient given referral to cardiology and resources to establish a PCP. Arm pain possibly due to overuse injury from his job. Patient neurovascularly intact. No hx of trauma. Mild bradycardia throughout ED visit however patient asymptomatic. Patient had improvements in his condition throughout his ED visit. Return precautions, discharge instructions, and follow-up was discussed with the patient before discharge.      Rechecks  4:15 PM = Patient appears more calm after informing him of results. States he was just very worried. Lungs clear to auscultation. Patient states he feels better after breathing treatment.     Discharge Medication List as of 05/31/2013  4:22 PM       Final impressions: 1. Shortness of breath   2. Chest pain   3. Arm pain      Luiz IronJessica Katlin Zykira Matlack PA-C   This patient was discussed with Dr. Geralyn FlashPollina    Gavriela Cashin K Chinedu Agustin, PA-C 06/02/13 1233

## 2013-06-02 ENCOUNTER — Telehealth: Payer: Self-pay

## 2013-06-05 ENCOUNTER — Encounter: Payer: Self-pay | Admitting: Cardiology

## 2013-06-05 ENCOUNTER — Encounter: Payer: Self-pay | Admitting: *Deleted

## 2013-06-05 DIAGNOSIS — R0600 Dyspnea, unspecified: Secondary | ICD-10-CM | POA: Insufficient documentation

## 2013-06-05 DIAGNOSIS — E669 Obesity, unspecified: Secondary | ICD-10-CM | POA: Insufficient documentation

## 2013-06-05 DIAGNOSIS — N419 Inflammatory disease of prostate, unspecified: Secondary | ICD-10-CM | POA: Insufficient documentation

## 2013-06-05 NOTE — ED Provider Notes (Signed)
Medical screening examination/treatment/procedure(s) were performed by non-physician practitioner and as supervising physician I was immediately available for consultation/collaboration.   EKG Interpretation   Date/Time:  Wednesday May 31 2013 12:53:21 EDT Ventricular Rate:  44 PR Interval:  154 QRS Duration: 90 QT Interval:  440 QTC Calculation: 376 R Axis:   33 Text Interpretation:  Marked sinus bradycardia Septal infarct , age  undetermined Abnormal ECG No significant change since last tracing  Confirmed by [facility]  MD, CHRISTOPHER [phone] on 05/31/2013 1:10:41 PM        Gilda Creasehristopher J. Pollina, MD 06/05/13 [phone]

## 2013-06-05 NOTE — Telephone Encounter (Signed)
error 

## 2013-06-07 ENCOUNTER — Ambulatory Visit (INDEPENDENT_AMBULATORY_CARE_PROVIDER_SITE_OTHER): Payer: Self-pay | Admitting: Cardiology

## 2013-06-07 ENCOUNTER — Encounter: Payer: Self-pay | Admitting: Cardiology

## 2013-06-07 VITALS — BP 112/84 | HR 58 | Ht 72.0 in | Wt 354.0 lb

## 2013-06-07 DIAGNOSIS — R079 Chest pain, unspecified: Secondary | ICD-10-CM | POA: Insufficient documentation

## 2013-06-07 NOTE — Assessment & Plan Note (Signed)
Previous symptoms atypical. Patient does not have exertional chest pain. No significant risk factors. I do not think further cardiac evaluation is indicated. We discussed lifestyle modification.

## 2013-06-07 NOTE — Progress Notes (Signed)
     HPI: 43 year old male for evaluation of chest pain. Seen in the emergency room on April 22 with complaints of chest pain and dyspnea. Troponins normal. Hemoglobin and potassium normal. Chest x-ray negative. Patient typically does not have dyspnea on exertion, orthopnea, PND, pedal edema, palpitations, syncope or exertional chest pain. He occasionally will feel a brief pain in his chest that is reproduced with palpation. He describes it as a soreness. When he was seen in the emergency room he felt the sensation as though he couldn't take a deep breath. He had some tingling in his hands. He did not have chest pain. Because of the above we were asked to evaluate.  Current Outpatient Prescriptions  Medication Sig Dispense Refill  . aspirin 81 MG tablet Take 81 mg by mouth daily.      . MULTIPLE VITAMIN PO Take by mouth.       No current facility-administered medications for this visit.    Allergies  Allergen Reactions  . Aspirin Other (See Comments)    High doses cause cold and flu like symptoms and trigger asthma     Past Medical History  Diagnosis Date  . Prostatitis   . Asthma   . Obesity   . Hyperlipidemia     Past Surgical History  Procedure Laterality Date  . Anus surgery    . Pyloric stenosis      History   Social History  . Marital Status: Married    Spouse Name: N/A    Number of Children: 2  . Years of Education: N/A   Occupational History  . CARPET CLEANING    Social History Main Topics  . Smoking status: Never Smoker   . Smokeless tobacco: Not on file  . Alcohol Use: Yes     Comment: Previous 1/4 to 1/5 per day; none at present  . Drug Use: No  . Sexual Activity: Not on file   Other Topics Concern  . Not on file   Social History Narrative  . No narrative on file    Family History  Problem Relation Age of Onset  . Heart disease Neg Hx     ROS: no fevers or chills, productive cough, hemoptysis, dysphasia, odynophagia, melena, hematochezia,  dysuria, hematuria, rash, seizure activity, orthopnea, PND, pedal edema, claudication. Remaining systems are negative.  Physical Exam:   Blood pressure 112/84, pulse 58, height 6' (1.829 m), weight 354 lb (160.573 kg).  General:  Well developed/obese in NAD Skin warm/dry Patient not depressed No peripheral clubbing Back-normal HEENT-normal/normal eyelids Neck supple/normal carotid upstroke bilaterally; no bruits; no JVD; no thyromegaly chest - CTA/ normal expansion CV - RRR/normal S1 and S2; no murmurs, rubs or gallops;  PMI nondisplaced Abdomen -NT/ND, no HSM, no mass, + bowel sounds, no bruit 2+ femoral pulses, no bruits Ext-no edema, chords, 2+ DP Neuro-grossly nonfocal  ECG 05/31/2013-marked sinus bradycardia with no ST changes.

## 2013-06-07 NOTE — Assessment & Plan Note (Signed)
We discussed weight loss today. There is high likelihood that he has sleep apnea. I have asked him to followup with primary care for this issue.

## 2013-07-12 ENCOUNTER — Ambulatory Visit: Payer: Self-pay | Admitting: Cardiology

## 2016-01-09 IMAGING — CR DG CHEST 2V
2 series · 2 of 2 positions shown · non-contrast
Comparison: DG CHEST 2 VIEW dated 01/09/2012

CLINICAL DATA: SHORTNESS OF BREATH ARM PAIN

EXAM:
CHEST  2 VIEW

[w chest pa]
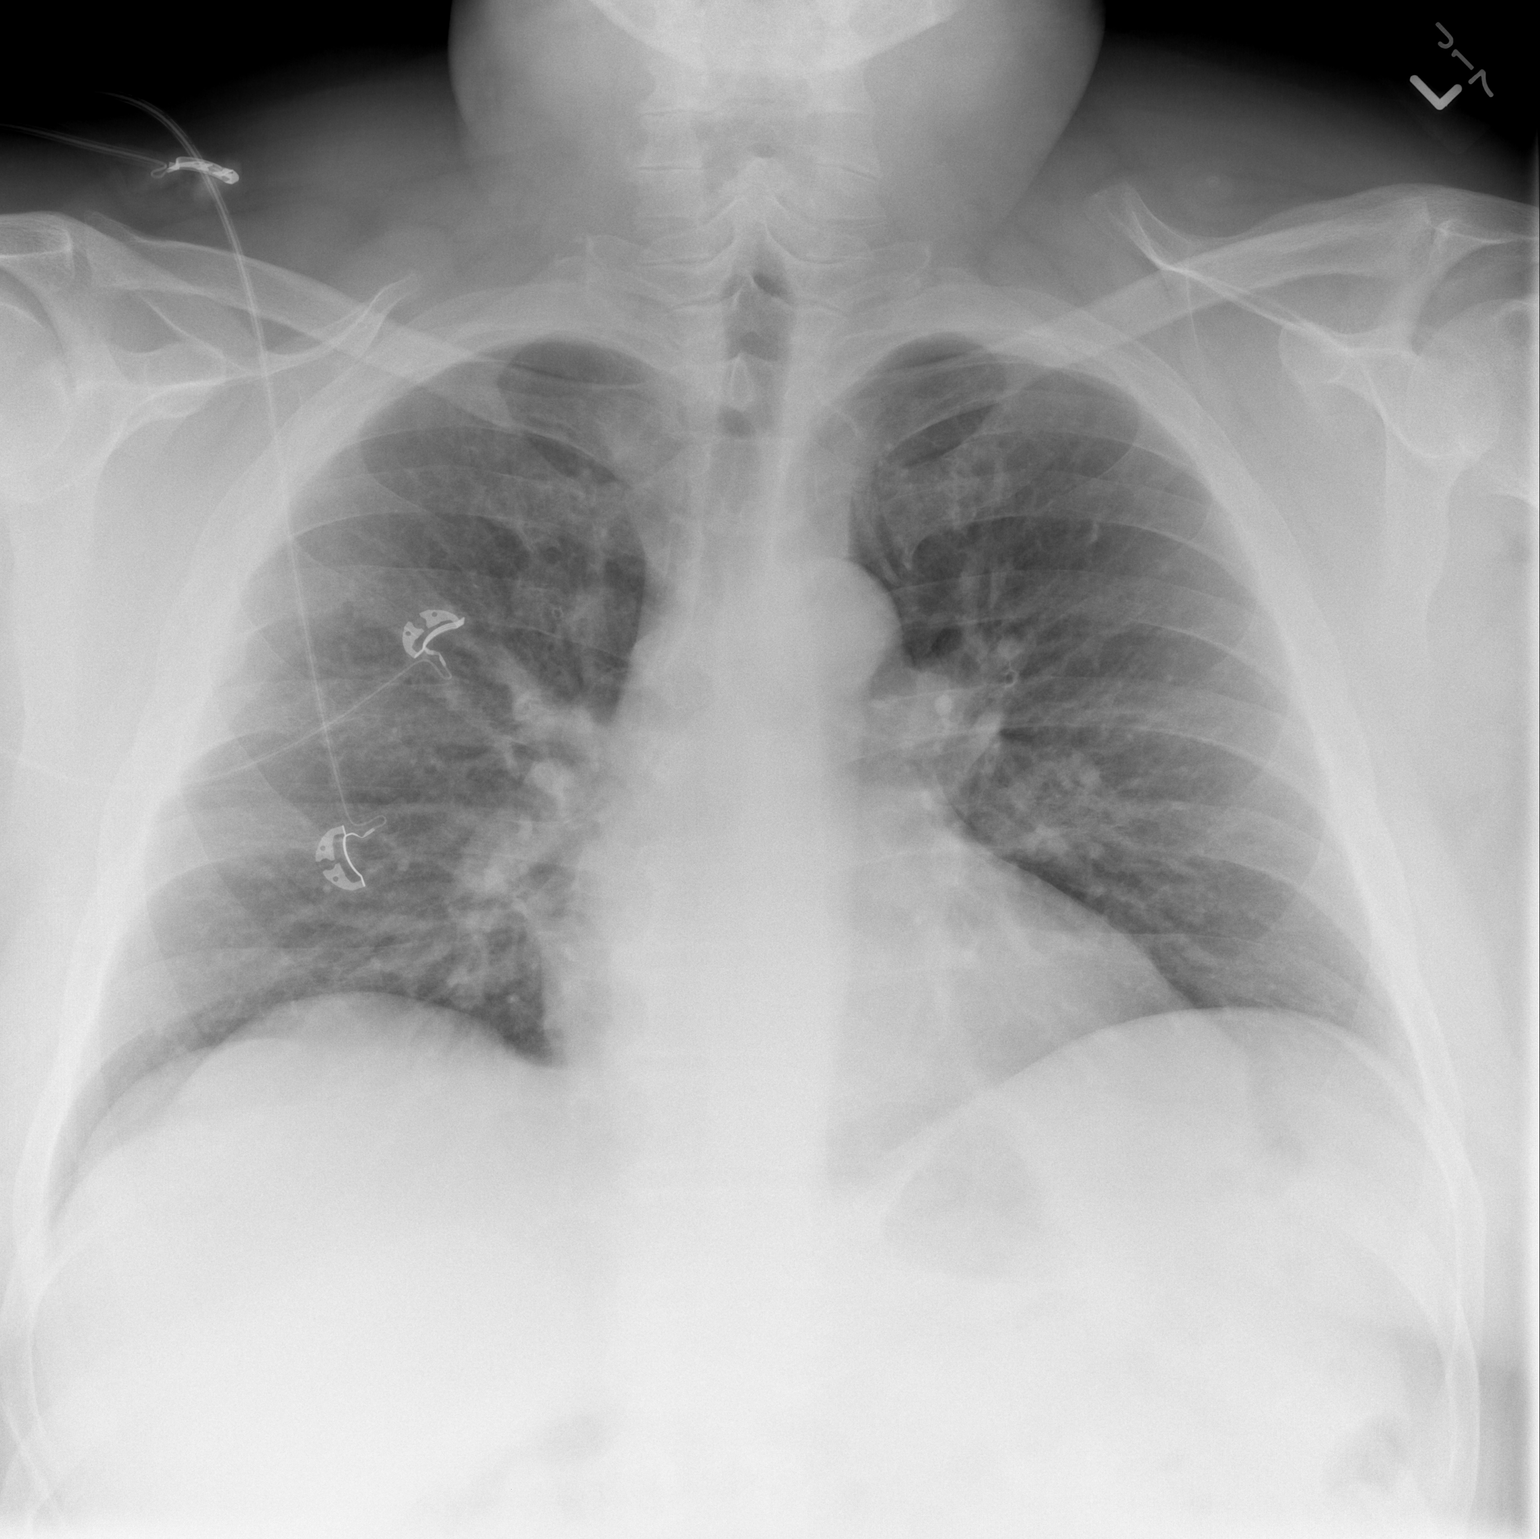

[w chest lat]
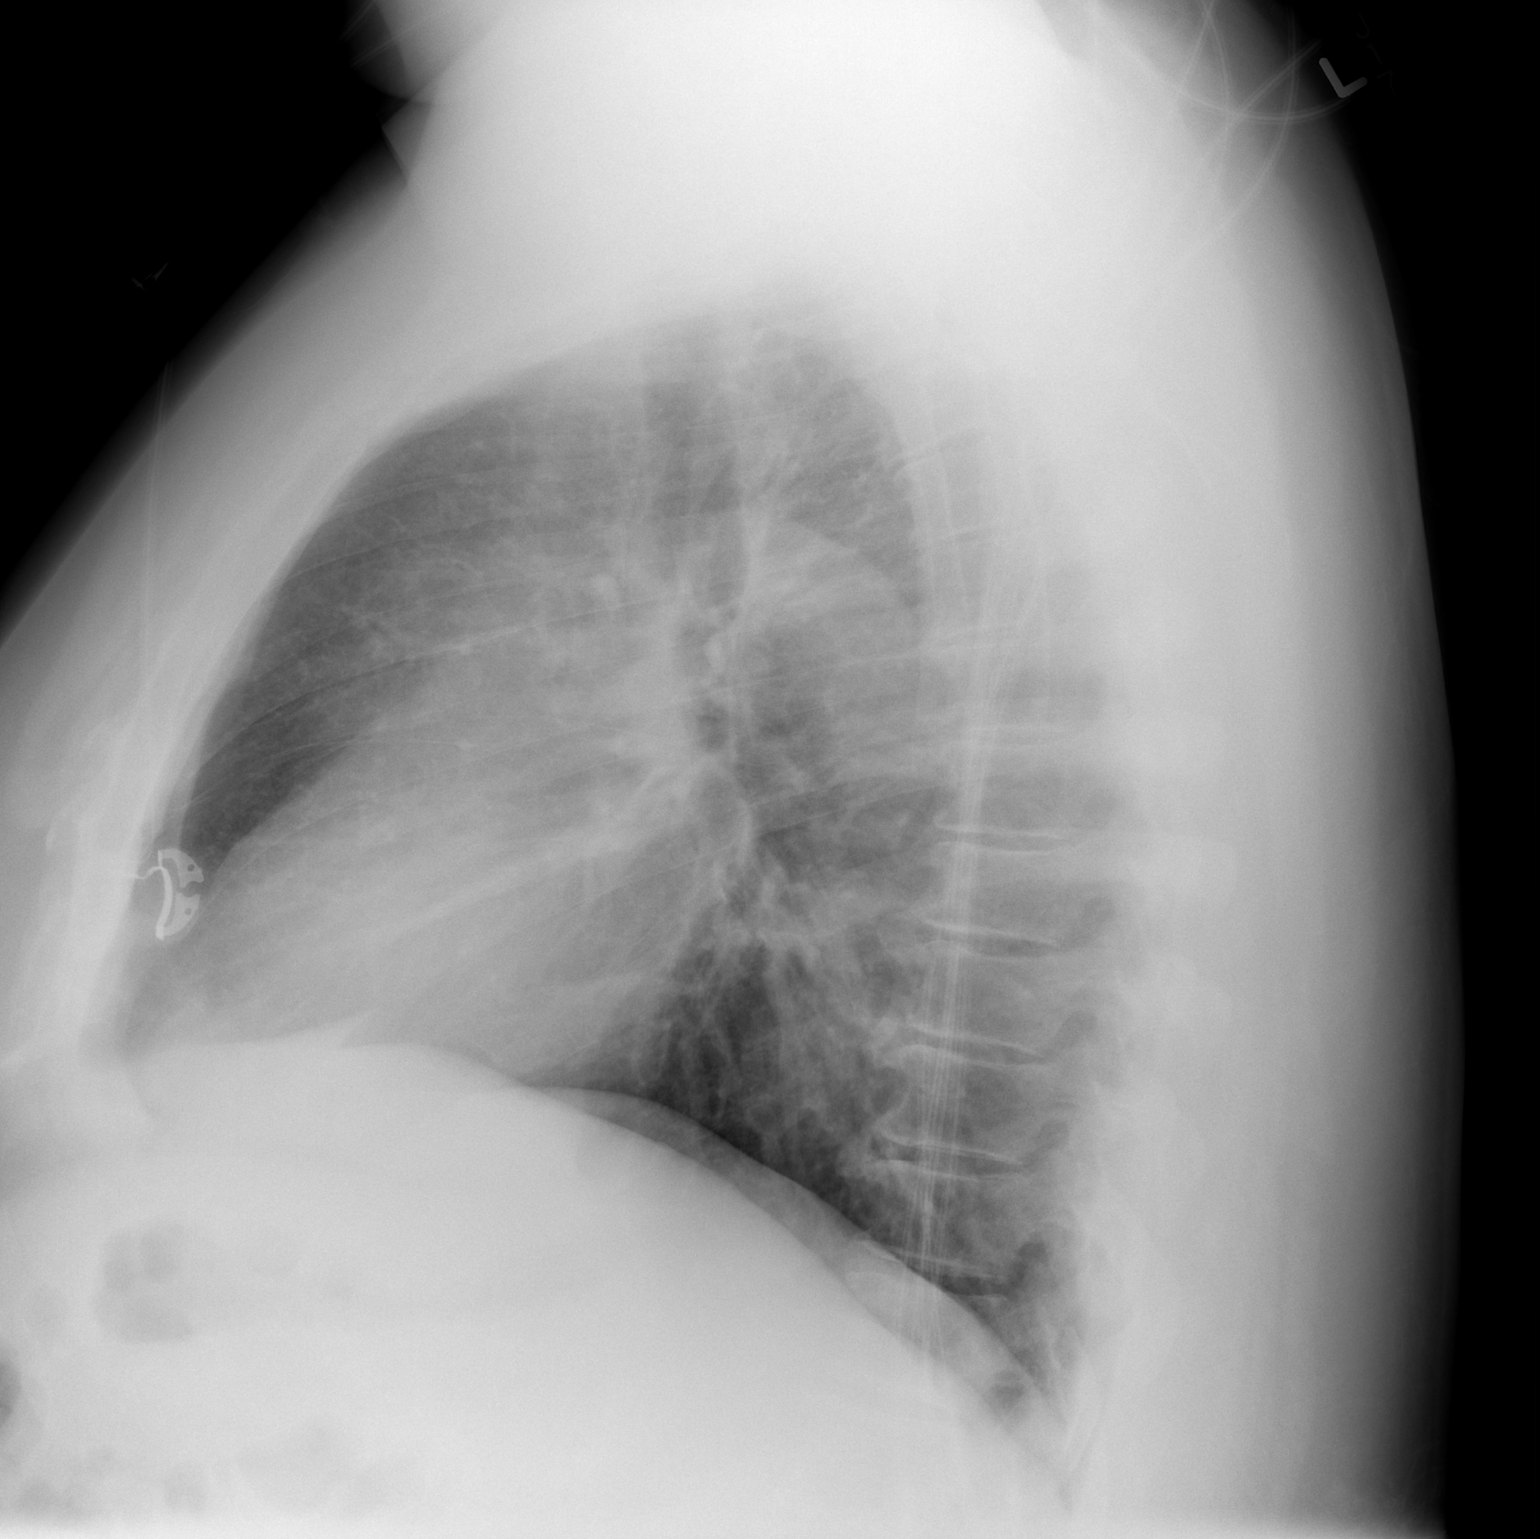

[2 of 2 positions shown; findings below may reference images not displayed]

FINDINGS: Low lung volumes. Cardiac silhouette within the upper limits normal.
Lungs are clear. Osseous structures unremarkable.
IMPRESSION: No active cardiopulmonary disease.

## 2017-04-29 ENCOUNTER — Other Ambulatory Visit: Payer: Self-pay | Admitting: Orthopedic Surgery

## 2017-04-29 DIAGNOSIS — S83242A Other tear of medial meniscus, current injury, left knee, initial encounter: Secondary | ICD-10-CM

## 2017-05-25 ENCOUNTER — Inpatient Hospital Stay: Admission: RE | Admit: 2017-05-25 | Payer: Self-pay | Source: Ambulatory Visit

## 2023-10-23 ENCOUNTER — Inpatient Hospital Stay
Admission: AD | Admit: 2023-10-23 | Discharge: 2023-10-26 | DRG: 885 | Disposition: A | Discharge status: Home / Self Care | Payer: 59 | Source: Other Acute Inpatient Hospital | Attending: Psychiatry | Admitting: Psychiatry

## 2023-10-23 ENCOUNTER — Encounter: Payer: Self-pay | Admitting: Psychiatry

## 2023-10-23 ENCOUNTER — Other Ambulatory Visit: Payer: Self-pay

## 2023-10-23 DIAGNOSIS — F322 Major depressive disorder, single episode, severe without psychotic features: Secondary | ICD-10-CM | POA: Diagnosis present

## 2023-10-23 DIAGNOSIS — E669 Obesity, unspecified: Secondary | ICD-10-CM | POA: Diagnosis present

## 2023-10-23 DIAGNOSIS — F1020 Alcohol dependence, uncomplicated: Secondary | ICD-10-CM | POA: Diagnosis present

## 2023-10-23 DIAGNOSIS — Z635 Disruption of family by separation and divorce: Secondary | ICD-10-CM | POA: Diagnosis not present

## 2023-10-23 DIAGNOSIS — Z7982 Long term (current) use of aspirin: Secondary | ICD-10-CM

## 2023-10-23 DIAGNOSIS — J45909 Unspecified asthma, uncomplicated: Secondary | ICD-10-CM | POA: Diagnosis present

## 2023-10-23 DIAGNOSIS — F329 Major depressive disorder, single episode, unspecified: Secondary | ICD-10-CM | POA: Diagnosis not present

## 2023-10-23 DIAGNOSIS — E785 Hyperlipidemia, unspecified: Secondary | ICD-10-CM | POA: Diagnosis present

## 2023-10-23 DIAGNOSIS — F17290 Nicotine dependence, other tobacco product, uncomplicated: Secondary | ICD-10-CM | POA: Diagnosis present

## 2023-10-23 LAB — GLUCOSE, CAPILLARY: Glucose-Capillary: 119 mg/dL — ABNORMAL HIGH (ref 70–99)

## 2023-10-23 MED ORDER — ACETAMINOPHEN 325 MG PO TABS: 650.0000 mg | ORAL_TABLET | Freq: Four times a day (QID) | ORAL | Status: DC | PRN

## 2023-10-23 MED ORDER — ADULT MULTIVITAMIN W/MINERALS CH
1.0000 | ORAL_TABLET | Freq: Every day | ORAL | Status: DC
  Administered 2023-10-24 – 2023-10-26 (×3): 1 via ORAL
  Filled 2023-10-23 (×3): qty 1

## 2023-10-23 MED ORDER — HALOPERIDOL LACTATE 5 MG/ML IJ SOLN: 5.0000 mg | Freq: Three times a day (TID) | INTRAMUSCULAR | Status: DC | PRN

## 2023-10-23 MED ORDER — LORAZEPAM 2 MG/ML IJ SOLN: 2.0000 mg | Freq: Three times a day (TID) | INTRAMUSCULAR | Status: DC | PRN

## 2023-10-23 MED ORDER — ONDANSETRON 4 MG PO TBDP: 4.0000 mg | ORAL_TABLET | Freq: Four times a day (QID) | ORAL | Status: DC | PRN

## 2023-10-23 MED ORDER — NICOTINE 14 MG/24HR TD PT24
14.0000 mg | MEDICATED_PATCH | Freq: Every day | TRANSDERMAL | Status: DC
Start: 2023-10-23 — End: 2023-10-26
  Administered 2023-10-23 – 2023-10-26 (×4): 14 mg via TRANSDERMAL
  Filled 2023-10-23 (×3): qty 1

## 2023-10-23 MED ORDER — HALOPERIDOL 5 MG PO TABS: 5.0000 mg | ORAL_TABLET | Freq: Three times a day (TID) | ORAL | Status: DC | PRN

## 2023-10-23 MED ORDER — ENSURE PLUS HIGH PROTEIN PO LIQD
237.0000 mL | Freq: Two times a day (BID) | ORAL | Status: DC
  Administered 2023-10-24 (×2): 237 mL via ORAL

## 2023-10-23 MED ORDER — TRAZODONE HCL 50 MG PO TABS
50.0000 mg | ORAL_TABLET | Freq: Every evening | ORAL | Status: DC | PRN
  Administered 2023-10-23 – 2023-10-25 (×3): 50 mg via ORAL
  Filled 2023-10-23 (×3): qty 1

## 2023-10-23 MED ORDER — MAGNESIUM HYDROXIDE 400 MG/5ML PO SUSP: 30.0000 mL | Freq: Every day | ORAL | Status: DC | PRN

## 2023-10-23 MED ORDER — CHLORDIAZEPOXIDE HCL 25 MG PO CAPS: 25.0000 mg | ORAL_CAPSULE | Freq: Four times a day (QID) | ORAL | Status: DC | PRN

## 2023-10-23 MED ORDER — LOPERAMIDE HCL 2 MG PO CAPS: 2.0000 mg | ORAL_CAPSULE | ORAL | Status: DC | PRN

## 2023-10-23 MED ORDER — DIPHENHYDRAMINE HCL 25 MG PO CAPS: 50.0000 mg | ORAL_CAPSULE | Freq: Three times a day (TID) | ORAL | Status: DC | PRN

## 2023-10-23 MED ORDER — LORAZEPAM 2 MG PO TABS: 2.0000 mg | ORAL_TABLET | Freq: Four times a day (QID) | ORAL | Status: DC | PRN

## 2023-10-23 MED ORDER — CHLORDIAZEPOXIDE HCL 25 MG PO CAPS
25.0000 mg | ORAL_CAPSULE | Freq: Four times a day (QID) | ORAL | Status: AC
  Administered 2023-10-24 (×4): 25 mg via ORAL
  Filled 2023-10-23 (×4): qty 1

## 2023-10-23 MED ORDER — ALUM & MAG HYDROXIDE-SIMETH 200-200-20 MG/5ML PO SUSP
30.0000 mL | ORAL | Status: DC | PRN
  Administered 2023-10-24: 30 mL via ORAL
  Filled 2023-10-23: qty 30

## 2023-10-23 MED ORDER — CHLORDIAZEPOXIDE HCL 25 MG PO CAPS
25.0000 mg | ORAL_CAPSULE | Freq: Every day | ORAL | Status: AC
  Administered 2023-10-26: 25 mg via ORAL
  Filled 2023-10-23: qty 1

## 2023-10-23 MED ORDER — CHLORDIAZEPOXIDE HCL 25 MG PO CAPS
25.0000 mg | ORAL_CAPSULE | ORAL | Status: AC
  Administered 2023-10-25 (×2): 25 mg via ORAL
  Filled 2023-10-23 (×2): qty 1

## 2023-10-23 MED ORDER — DIPHENHYDRAMINE HCL 50 MG/ML IJ SOLN: 50.0000 mg | Freq: Three times a day (TID) | INTRAMUSCULAR | Status: DC | PRN

## 2023-10-23 MED ORDER — HALOPERIDOL LACTATE 5 MG/ML IJ SOLN: 10.0000 mg | Freq: Three times a day (TID) | INTRAMUSCULAR | Status: DC | PRN

## 2023-10-23 MED ORDER — THIAMINE HCL 100 MG/ML IJ SOLN: 100.0000 mg | Freq: Once | INTRAMUSCULAR | Status: DC

## 2023-10-23 MED ORDER — CHLORDIAZEPOXIDE HCL 25 MG PO CAPS
50.0000 mg | ORAL_CAPSULE | Freq: Four times a day (QID) | ORAL | Status: AC
  Administered 2023-10-23: 50 mg via ORAL
  Filled 2023-10-23: qty 2

## 2023-10-23 MED ORDER — HYDROXYZINE HCL 25 MG PO TABS: 25.0000 mg | ORAL_TABLET | Freq: Four times a day (QID) | ORAL | Status: DC | PRN

## 2023-10-23 NOTE — Plan of Care (Signed)
   Problem: Education: Goal: Knowledge of [facility] Education information/materials will improve Outcome: Progressing   Problem: Education: Goal: Emotional status will improve Outcome: Progressing   Problem: Education: Goal: Mental status will improve Outcome: Progressing   Problem: Education: Goal: Verbalization of understanding the information provided will improve Outcome: Progressing

## 2023-10-23 NOTE — Progress Notes (Signed)
 Pt admitted to [facility] transferred from [facility].  Pt denies SI HI AVH.  Pt endorsed mild w/d symptoms and CIWA at 6 pm =3.  Pt states he is embarrassed and ashamed of what happened when he over-drank and he does  not remember much about last night.  Pt has bruises and abrasions from injury while intoxicated.  Pt states he would like to quit altogether and endorsed drinking too much or not at all.  Pt vapes and was ordered nicotine  replacement therapy and expressed an interest in quitting and using patches after d/c.  Pt denies any physical issues or any prior mental health concerns.  Pt was oriented to the unit and procedures and encouraged to participate in programming.  Pt belongings were secured to his satisfaction.  Q 15 minute checks initiated.    10/23/23 1800  Psych Admission Type (Psych Patients Only)  Admission Status Voluntary  Psychosocial Assessment  Patient Complaints Anxiety  Eye Contact Fair  Facial Expression Animated  Affect Anxious  Speech Soft  Interaction Assertive  Motor Activity Slow  Appearance/Hygiene Disheveled  Behavior Characteristics Cooperative  Mood Ashamed/humiliated  Thought Process  Coherency WDL  Content WDL  Delusions None reported or observed  Perception WDL  Hallucination None reported or observed  Judgment Limited  Confusion None  Danger to Self  Current suicidal ideation? Denies  Danger to Others  Danger to Others None reported or observed

## 2023-10-24 DIAGNOSIS — F329 Major depressive disorder, single episode, unspecified: Secondary | ICD-10-CM

## 2023-10-24 LAB — COMPREHENSIVE METABOLIC PANEL WITH GFR
ALT: 29 U/L (ref 0–44)
AST: 45 U/L — ABNORMAL HIGH (ref 15–41)
Albumin: 3.7 g/dL (ref 3.5–5.0)
Alkaline Phosphatase: 80 U/L (ref 38–126)
Anion gap: 9 (ref 5–15)
BUN: 20 mg/dL (ref 6–20)
CO2: 27 mmol/L (ref 22–32)
Calcium: 8.7 mg/dL — ABNORMAL LOW (ref 8.9–10.3)
Chloride: 98 mmol/L (ref 98–111)
Creatinine, Ser: 0.82 mg/dL (ref 0.61–1.24)
GFR, Estimated: >60 mL/min (ref >60)
Glucose, Bld: 114 mg/dL — ABNORMAL HIGH (ref 70–99)
Potassium: 3.9 mmol/L (ref 3.5–5.1)
Sodium: 134 mmol/L — ABNORMAL LOW (ref 135–145)
Total Bilirubin: 1.4 mg/dL — ABNORMAL HIGH (ref 0.0–1.2)
Total Protein: 6.8 g/dL (ref 6.5–8.1)

## 2023-10-24 NOTE — [facility] Suicide Risk Assessment (Addendum)
 [facility] Admission Suicide Risk Assessment   Nursing information obtained from:  Patient Demographic factors:  Male, Caucasian Current Mental Status:  NA Loss Factors:  NA, Financial problems / change in socioeconomic status Historical Factors:  NA Risk Reduction Factors:  Employed, Living with another person, especially a relative, Positive social support  Total Time spent with patient: 30 minutes Principal Problem: MDD (major depressive disorder), severe (HCC) Diagnosis:  Principal Problem:   MDD (major depressive disorder), severe (HCC)  Subjective Data: Patient is a 53 year old white male with no prior psychiatric history but with history of alcohol use was presented to the ED by EMS after falling down approximately 3 steps in the context of alcohol intoxication.  Fall was unwitnessed, unknown loss of consciousness.  Per chart patient reportedly acknowledged that he drank pesticide earlier.  Per chart patient stated this was a substance called D-Tron Landy.  Per chart patient informed ED provider that he did the ingestion as an attempt to harm himself.  Per ED note patient is unable or unwilling to give any other history.  Patient is currently admitted to adult inpatient psychiatric unit for observation and stabilization.  Continued Clinical Symptoms:  Alcohol Use Disorder Identification Test Final Score (AUDIT): 19 The Alcohol Use Disorders Identification Test, Guidelines for Use in Primary Care, Second Edition.  World Science writer [facility]). Score between 0-7:  no or low risk or alcohol related problems. Score between 8-15:  moderate risk of alcohol related problems. Score between 16-19:  high risk of alcohol related problems. Score 20 or above:  warrants further diagnostic evaluation for alcohol dependence and treatment.   CLINICAL FACTORS:   Alcohol/Substance Abuse/Dependencies   Musculoskeletal: Strength & Muscle Tone: within normal limits Gait & Station: normal Patient  leans: N/A  Psychiatric Specialty Exam:  Presentation  General Appearance: Appropriate for Environment; Casual  Eye Contact:Fair  Speech:Clear and Coherent  Speech Volume:Increased  Handedness:Right   Mood and Affect  Mood:Anxious  Affect:Labile   Thought Process  Thought Processes:Coherent  Descriptions of Associations:Intact  Orientation:Full (Time, Place and Person)  Thought Content:Illogical  History of Schizophrenia/Schizoaffective disorder:No data recorded Duration of Psychotic Symptoms:No data recorded Hallucinations:Hallucinations: None  Ideas of Reference:None  Suicidal Thoughts:Suicidal Thoughts: No  Homicidal Thoughts:Homicidal Thoughts: No   Sensorium  Memory:Immediate Fair; Recent Fair; Remote Fair  Judgment:Impaired  Insight:Shallow   Executive Functions  Concentration:Fair  Attention Span:Fair  Recall:Fair  Fund of Knowledge:Fair  Language:Fair   Psychomotor Activity  Psychomotor Activity:Psychomotor Activity: Normal   Assets  Assets:Communication Skills; Desire for Improvement   Sleep  Sleep:Sleep: Fair    Physical Exam: Physical Exam Vitals and nursing note reviewed.    ROS Blood pressure 121/76, pulse (!) 59, temperature (!) 97.1 F (36.2 C), temperature source Temporal, resp. rate 18, height 6' (1.829 m), weight 94.3 kg, SpO2 100%. Body mass index is 28.21 kg/m.   COGNITIVE FEATURES THAT CONTRIBUTE TO RISK:  None    SUICIDE RISK:   Minimal: No identifiable suicidal ideation.  Patients presenting with no risk factors but with morbid ruminations; may be classified as minimal risk based on the severity of the depressive symptoms  PLAN OF CARE: Patient is admitted to adult psych unit with Q15 min safety monitoring. Multidisciplinary team approach is offered. Medication management; group/milieu therapy is offered.   I certify that inpatient services furnished can reasonably be expected to improve the patient's  condition.   Allyn Foil, MD 10/24/2023, 4:48 PM
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
## 2023-10-24 NOTE — Group Note (Signed)
 Date:  10/24/2023 Time:  10:27 PM  Group Topic/Focus:  Goals Group:   The focus of this group is to help patients establish daily goals to achieve during treatment and discuss how the patient can incorporate goal setting into their daily lives to aide in recovery. Making Healthy Choices:   The focus of this group is to help patients identify negative/unhealthy choices they were using prior to admission and identify positive/healthier coping strategies to replace them upon discharge. Self Care:   The focus of this group is to help patients understand the importance of self-care in order to improve or restore emotional, physical, spiritual, interpersonal, and financial health.    Participation Level:  Active  Participation Quality:  Appropriate and Attentive  Affect:  Appropriate  Cognitive:  Alert, Appropriate, and Oriented  Insight: Appropriate and Good  Engagement in Group:  Engaged  Modes of Intervention:  Discussion and Support  Additional Comments:  N/A  Scott Larson 10/24/2023, 10:27 PM

## 2023-10-24 NOTE — Plan of Care (Signed)
   Problem: Education: Goal: Knowledge of [facility] Education information/materials will improve Outcome: Progressing Goal: Emotional status will improve Outcome: Progressing Goal: Mental status will improve Outcome: Progressing Goal: Verbalization of understanding the information provided will improve Outcome: Progressing   Problem: Activity: Goal: Interest or engagement in activities will improve Outcome: Progressing Goal: Sleeping patterns will improve Outcome: Progressing   Problem: Coping: Goal: Ability to verbalize frustrations and anger appropriately will improve Outcome: Progressing Goal: Ability to demonstrate self-control will improve Outcome: Progressing

## 2023-10-24 NOTE — [facility] Suicide Risk Assessment (Signed)
 [facility] INPATIENT:  Family/Significant Other Suicide Prevention Education  Suicide Prevention Education:  Education Completed; Dorian Kelly, girlfriend [phone]  (name of family member/significant other) has been identified by the patient as the family member/significant other with whom the patient will be residing, and identified as the person(s) who will aid the patient in the event of a mental health crisis (suicidal ideations/suicide attempt).  With written consent from the patient, the family member/significant other has been provided the following suicide prevention education, prior to the and/or following the discharge of the patient.  The suicide prevention education provided includes the following: Suicide risk factors Suicide prevention and interventions National Suicide Hotline telephone number [facility] assessment telephone number [facility] Emergency Assistance 911 [facility] and/or Residential Mobile Crisis Unit telephone number  Request made of family/significant other to: Remove weapons (e.g., guns, rifles, knives), all items previously/currently identified as safety concern.   Remove drugs/medications (over-the-counter, prescriptions, illicit drugs), all items previously/currently identified as a safety concern.  The family member/significant other verbalizes understanding of the suicide prevention education information provided.  The family member/significant other agrees to remove the items of safety concern listed above.  Pamila Nine 10/24/2023, 3:55 PM

## 2023-10-24 NOTE — Progress Notes (Signed)
 Pt visible in milieu and noted interacting with selective peers.  Attended and participated in wrap up group; and denied active withdrawal symptoms. Medications compliant.  Expressed concern about length of stay IP, endorsed anxiety r/t being in here.   10/23/23 2200  Psych Admission Type (Psych Patients Only)  Admission Status Voluntary  Psychosocial Assessment  Patient Complaints Anxiety  Eye Contact Fair  Facial Expression Animated  Affect Anxious  Speech Soft  Interaction Assertive  Motor Activity Slow  Appearance/Hygiene Unremarkable  Behavior Characteristics Cooperative;Appropriate to situation  Mood Anxious  Thought Process  Coherency WDL  Content WDL  Delusions None reported or observed  Perception WDL  Hallucination None reported or observed  Judgment Limited  Confusion None  Danger to Self  Current suicidal ideation? Denies  Agreement Not to Harm Self Yes  Description of Agreement Verbal  Danger to Others  Danger to Others None reported or observed

## 2023-10-24 NOTE — Plan of Care (Signed)
   Problem: Education: Goal: Knowledge of [facility] Education information/materials will improve Outcome: Progressing Goal: Emotional status will improve Outcome: Progressing Goal: Mental status will improve Outcome: Progressing Goal: Verbalization of understanding the information provided will improve Outcome: Progressing

## 2023-10-24 NOTE — Group Note (Signed)
 Date:  10/24/2023 Time:  5:25 PM  Group Topic/Focus:  Goals Group:   The focus of this group is to help patients establish daily goals to achieve during treatment and discuss how the patient can incorporate goal setting into their daily lives to aide in recovery.    Participation Level:  Active  Participation Quality:  Appropriate  Affect:  Appropriate  Cognitive:  Alert  Insight: Appropriate  Engagement in Group:  Engaged  Modes of Intervention:  Activity, Discussion, and Education  Additional Comments:    Scott Larson Bennett 10/24/2023, 5:25 PM
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
## 2023-10-24 NOTE — Progress Notes (Addendum)
 Pt's OOB and seen in dayroom interacting with selective peers, attended and participated in wrap up group.  During 1:1 with staff, expressed concern about the state of his kidneys and liver that they were not impacted as a result of alcohol use.  Pt endorsed anxiety rated mild to moderate; he denied SI/HI and depression.  He's motivated for sobriety maintenance. Pt denied any acute withdrawal symptoms. When aroused this morning, Pt c/o of being tired and believes it is caused by the Librium .    10/24/23 2200  Psych Admission Type (Psych Patients Only)  Admission Status Voluntary  Psychosocial Assessment  Patient Complaints Anxiety  Eye Contact Fair  Facial Expression Animated  Affect Anxious  Speech Soft  Interaction Assertive  Motor Activity Other (Comment) (WDL)  Appearance/Hygiene Unremarkable  Behavior Characteristics Appropriate to situation;Cooperative  Mood Anxious  Thought Process  Coherency WDL  Content WDL  Delusions None reported or observed  Perception WDL  Hallucination None reported or observed  Judgment Limited  Confusion None  Danger to Self  Current suicidal ideation? Denies  Agreement Not to Harm Self Yes  Description of Agreement Verbal  Danger to Others  Danger to Others None reported or observed

## 2023-10-24 NOTE — Progress Notes (Signed)
   10/24/23 1800  Psych Admission Type (Psych Patients Only)  Admission Status Voluntary  Psychosocial Assessment  Patient Complaints Anxiety  Eye Contact Fair  Facial Expression Animated  Affect Anxious  Speech Soft  Interaction Assertive  Motor Activity Slow  Appearance/Hygiene Unremarkable  Behavior Characteristics Cooperative  Mood Anxious  Thought Process  Coherency WDL  Content WDL  Delusions None reported or observed  Perception WDL  Hallucination None reported or observed  Judgment Limited  Confusion None  Danger to Self  Current suicidal ideation? Denies  Agreement Not to Harm Self Yes  Description of Agreement verbal  Danger to Others  Danger to Others None reported or observed

## 2023-10-25 DIAGNOSIS — F1020 Alcohol dependence, uncomplicated: Secondary | ICD-10-CM

## 2023-10-25 DIAGNOSIS — F322 Major depressive disorder, single episode, severe without psychotic features: Principal | ICD-10-CM

## 2023-10-25 MED ORDER — LORAZEPAM 2 MG/ML IJ SOLN: 2.0000 mg | Freq: Three times a day (TID) | INTRAMUSCULAR | Status: DC | PRN

## 2023-10-25 MED ORDER — DIPHENHYDRAMINE HCL 50 MG/ML IJ SOLN: 50.0000 mg | Freq: Three times a day (TID) | INTRAMUSCULAR | Status: DC | PRN

## 2023-10-25 MED ORDER — FOLIC ACID 1 MG PO TABS
1.0000 mg | ORAL_TABLET | Freq: Every day | ORAL | Status: DC
  Administered 2023-10-25 – 2023-10-26 (×2): 1 mg via ORAL
  Filled 2023-10-25 (×2): qty 1

## 2023-10-25 MED ORDER — ENSURE PLUS HIGH PROTEIN PO LIQD
237.0000 mL | Freq: Three times a day (TID) | ORAL | Status: DC
  Administered 2023-10-25 – 2023-10-26 (×3): 237 mL via ORAL

## 2023-10-25 MED ORDER — HALOPERIDOL LACTATE 5 MG/ML IJ SOLN: 10.0000 mg | Freq: Three times a day (TID) | INTRAMUSCULAR | Status: DC | PRN

## 2023-10-25 NOTE — Plan of Care (Signed)
   Problem: Education: Goal: Knowledge of [facility] Education information/materials will improve Outcome: Progressing Goal: Emotional status will improve Outcome: Progressing Goal: Mental status will improve Outcome: Progressing Goal: Verbalization of understanding the information provided will improve Outcome: Progressing   Problem: Activity: Goal: Interest or engagement in activities will improve Outcome: Progressing Goal: Sleeping patterns will improve Outcome: Progressing   Problem: Coping: Goal: Ability to verbalize frustrations and anger appropriately will improve Outcome: Progressing Goal: Ability to demonstrate self-control will improve Outcome: Progressing

## 2023-10-25 NOTE — Progress Notes (Signed)
 [facility] MD Progress Note  10/25/2023 1:38 PM Scott Larson  MRN:  [id number]  Patient is a 53 year old white male with no prior psychiatric history but with history of alcohol use was presented to the ED by EMS after falling down approximately 3 steps in the context of alcohol intoxication. Fall was unwitnessed, unknown loss of consciousness. Per chart patient reportedly acknowledged that he drank pesticide earlier. Per chart patient stated this was a substance called D-Tron Landy. Per chart patient informed ED provider that he did the ingestion as an attempt to harm himself. Per ED note patient is unable or unwilling to give any other history. Patient is currently admitted to adult inpatient psychiatric unit for observation and stabilization.   Subjective:  Chart reviewed, case discussed in multidisciplinary meeting, patient seen during rounds.  Patient met with the treatment team.  He explained his presentation to the ED and the stressors that made him drink alcohol.  He consistently denies drinking any pesticide and denies having any suicidal/homicidal ideation/intent/plan.  Social work team has reached out to patient's girlfriend who also reports feeling safe for patient to return home.  She reports that she has secured a firearm from the house and she has no safety concerns about the patient hurting himself.  Patient denies auditory/visual hallucinations.  Patient has fair appetite and sleep.   Sleep: Fair  Appetite:  Fair  Past Psychiatric History: see h&P Family History:  Family History  Problem Relation Age of Onset   Heart disease Neg Hx    Social History:  Social History   Substance and Sexual Activity  Alcohol Use Yes   Comment: Previous 1/4 to 1/5 per day; none at present     Social History   Substance and Sexual Activity  Drug Use No    Social History   Socioeconomic History   Marital status: Married    Spouse name: Not on file   Number of children: 2   Years of  education: Not on file   Highest education level: Not on file  Occupational History   Occupation: Patent examiner    Employer: [employer]  Tobacco Use   Smoking status: Never   Smokeless tobacco: Current  Vaping Use   Vaping status: Every Day  Substance and Sexual Activity   Alcohol use: Yes    Comment: Previous 1/4 to 1/5 per day; none at present   Drug use: No   Sexual activity: Not on file  Other Topics Concern   Not on file  Social History Narrative   Not on file   Social Drivers of Health   Financial Resource Strain: Not on file  Food Insecurity: No Food Insecurity (10/23/2023)   Hunger Vital Sign    Worried About Running Out of Food in the Last Year: Never true    Ran Out of Food in the Last Year: Never true  Transportation Needs: No Transportation Needs (10/23/2023)   PRAPARE - Administrator, Civil Service (Medical): No    Lack of Transportation (Non-Medical): No  Physical Activity: Not on file  Stress: Not on file  Social Connections: Not on file   Past Medical History:  Past Medical History:  Diagnosis Date   Asthma    Hyperlipidemia    Obesity    Prostatitis     Past Surgical History:  Procedure Laterality Date   ANUS SURGERY     Pyloric stenosis      Current Medications: Current Facility-Administered Medications  Medication  Dose Route Frequency Provider Last Rate Last Admin   acetaminophen  (TYLENOL ) tablet 650 mg  650 mg Oral Q6H PRN Coleman, Carolyn H, NP       alum & mag hydroxide-simeth (MAALOX/MYLANTA) 200-200-20 MG/5ML suspension 30 mL  30 mL Oral Q4H PRN Coleman, Carolyn H, NP   30 mL at 10/24/23 9491   chlordiazePOXIDE  (LIBRIUM ) capsule 25 mg  25 mg Oral Q6H PRN Mardy Elveria DEL, NP       chlordiazePOXIDE  (LIBRIUM ) capsule 25 mg  25 mg Oral BH-qamhs Coleman, Carolyn H, NP   25 mg at 10/25/23 0556   Followed by   NOREEN ON 10/26/2023] chlordiazePOXIDE  (LIBRIUM ) capsule 25 mg  25 mg Oral Daily Coleman, Carolyn H, NP        haloperidol  (HALDOL ) tablet 5 mg  5 mg Oral TID PRN Mardy Elveria DEL, NP       And   diphenhydrAMINE  (BENADRYL ) capsule 50 mg  50 mg Oral TID PRN Coleman, Carolyn H, NP       haloperidol  lactate (HALDOL ) injection 5 mg  5 mg Intramuscular TID PRN Mardy Elveria DEL, NP       And   diphenhydrAMINE  (BENADRYL ) injection 50 mg  50 mg Intramuscular TID PRN Mardy Elveria DEL, NP       And   LORazepam  (ATIVAN ) injection 2 mg  2 mg Intramuscular TID PRN Coleman, Carolyn H, NP       haloperidol  lactate (HALDOL ) injection 10 mg  10 mg Intramuscular TID PRN Zamoria Boss, MD       And   diphenhydrAMINE  (BENADRYL ) injection 50 mg  50 mg Intramuscular TID PRN Gracie Gupta, MD       And   LORazepam  (ATIVAN ) injection 2 mg  2 mg Intramuscular TID PRN Miu Chiong, MD       feeding supplement (ENSURE PLUS HIGH PROTEIN) liquid 237 mL  237 mL Oral TID BM Jos Cygan, MD   237 mL at 10/25/23 0857   folic acid  (FOLVITE ) tablet 1 mg  1 mg Oral Daily Umeka Wrench, MD   1 mg at 10/25/23 9142   hydrOXYzine  (ATARAX ) tablet 25 mg  25 mg Oral Q6H PRN Coleman, Carolyn H, NP       loperamide  (IMODIUM ) capsule 2-4 mg  2-4 mg Oral PRN Coleman, Carolyn H, NP       LORazepam  (ATIVAN ) tablet 2 mg  2 mg Oral Q6H PRN Mardy Elveria DEL, NP       Or   LORazepam  (ATIVAN ) injection 2 mg  2 mg Intramuscular Q8H PRN Coleman, Carolyn H, NP       magnesium  hydroxide (MILK OF MAGNESIA) suspension 30 mL  30 mL Oral Daily PRN Coleman, Carolyn H, NP       multivitamin with minerals tablet 1 tablet  1 tablet Oral Daily Mardy Elveria DEL, NP   1 tablet at 10/25/23 0856   nicotine  (NICODERM CQ  - dosed in mg/24 hours) patch 14 mg  14 mg Transdermal Daily Millington, Matthew E, PA-C   14 mg at 10/25/23 9142   ondansetron  (ZOFRAN -ODT) disintegrating tablet 4 mg  4 mg Oral Q6H PRN Coleman, Carolyn H, NP       traZODone  (DESYREL ) tablet 50 mg  50 mg Oral QHS PRN Coleman, Carolyn H, NP   50 mg at 10/24/23 2116    Lab Results:   Results for orders placed or performed during the hospital encounter of 10/23/23 (from the past 48 hours)  Glucose, capillary  Status: Abnormal   Collection Time: 10/23/23  7:57 PM  Result Value Ref Range   Glucose-Capillary 119 (H) 70 - 99 mg/dL    Comment: Glucose reference range applies only to samples taken after fasting for at least 8 hours.  Comprehensive metabolic panel with GFR     Status: Abnormal   Collection Time: 10/24/23  5:16 PM  Result Value Ref Range   Sodium 134 (L) 135 - 145 mmol/L   Potassium 3.9 3.5 - 5.1 mmol/L   Chloride 98 98 - 111 mmol/L   CO2 27 22 - 32 mmol/L   Glucose, Bld 114 (H) 70 - 99 mg/dL    Comment: Glucose reference range applies only to samples taken after fasting for at least 8 hours.   BUN 20 6 - 20 mg/dL   Creatinine, Ser 9.17 0.61 - 1.24 mg/dL   Calcium 8.7 (L) 8.9 - 10.3 mg/dL   Total Protein 6.8 6.5 - 8.1 g/dL   Albumin 3.7 3.5 - 5.0 g/dL   AST 45 (H) 15 - 41 U/L   ALT 29 0 - 44 U/L   Alkaline Phosphatase 80 38 - 126 U/L   Total Bilirubin 1.4 (H) 0.0 - 1.2 mg/dL   GFR, Estimated >39 >39 mL/min    Comment: (NOTE) Calculated using the CKD-EPI Creatinine Equation (2021)    Anion gap 9 5 - 15    Comment: Performed at [facility], [address] [place], [place] [address]    Blood Alcohol level:  No results found for: [facility]  Metabolic Disorder Labs: No results found for: HGBA1C, MPG No results found for: PROLACTIN No results found for: CHOL, TRIG, HDL, CHOLHDL, VLDL, LDLCALC  Physical Findings: AIMS:  , ,  ,  ,    CIWA:  CIWA-Ar Total: 1 COWS:      Psychiatric Specialty Exam:  Presentation  General Appearance:  Appropriate for Environment; Casual  Eye Contact: Fair  Speech: Clear and Coherent  Speech Volume: Increased    Mood and Affect  Mood: Anxious  Affect: Stable  Thought Process  Thought Processes: Coherent  Descriptions of Associations:Intact  Orientation:Full  (Time, Place and Person)  Thought Content: Linear Hallucinations:Hallucinations: None  Ideas of Reference:None  Suicidal Thoughts:Suicidal Thoughts: No  Homicidal Thoughts:Homicidal Thoughts: No   Sensorium  Memory: Immediate Fair; Recent Fair; Remote Fair  Judgment: Improved Insight: Improved  Executive Functions  Concentration: Fair  Attention Span: Fair  Recall: [employer] of Knowledge: Fair  Language: Fair   Psychomotor Activity  Psychomotor Activity: Psychomotor Activity: Normal  Musculoskeletal: Strength & Muscle Tone: within normal limits Gait & Station: normal Assets  Assets: Manufacturing systems engineer; Desire for Improvement    Physical Exam: Physical Exam Vitals and nursing note reviewed.    ROS Blood pressure 107/70, pulse (!) 48, temperature 97.6 F (36.4 C), temperature source Oral, resp. rate 18, height 6' (1.829 m), weight 94.3 kg, SpO2 99%. Body mass index is 28.21 kg/m.  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  Patient is currently denying SI/HI/plan and remains discharge focused.  Patient needs to be monitored closely for alcohol withdrawal.  Treatment Plan Summary:  Safety and Monitoring:             -- Voluntary admission to inpatient psychiatric unit for safety, stabilization and treatment             --  Daily contact with patient to assess and evaluate symptoms and progress in treatment             -- Patient's case to be discussed in multi-disciplinary team meeting             -- Observation Level: q15 minute checks             -- Vital signs:  q12 hours             -- Precautions: suicide, elopement, and assault   2. Psychiatric Diagnoses and Treatment:               Patient is not  agreeable for any psychotropic medications at this time.  Will monitor closely for alcohol detox   -- The risks/benefits/side-effects/alternatives to this medication were discussed in detail with the patient and time was given for questions. The patient consents to medication trial.                -- Metabolic profile and EKG monitoring obtained while on an atypical antipsychotic (BMI: Lipid Panel: HbgA1c: QTc:)              -- Encouraged patient to participate in unit milieu and in scheduled group therapies                            3. Medical Issues Being Addressed:  Alcohol withdrawal-CIWA protocol   4. Discharge Planning:   -- Social work and case management to assist with discharge planning and identification of hospital follow-up needs prior to discharge  -- Estimated LOS: 10/26/2023  Allyn Foil, MD 10/25/2023, 1:38 PM

## 2023-10-25 NOTE — Progress Notes (Signed)
   10/25/23 1000  Psych Admission Type (Psych Patients Only)  Admission Status Voluntary  Psychosocial Assessment  Patient Complaints None  Eye Contact Fair  Facial Expression Animated  Affect Appropriate to circumstance  Speech Soft  Interaction Assertive  Motor Activity Other (Comment) (WNL)  Appearance/Hygiene Unremarkable  Behavior Characteristics Cooperative;Appropriate to situation  Mood Pleasant  Thought Process  Coherency WDL  Content WDL  Delusions None reported or observed  Perception WDL  Hallucination None reported or observed  Judgment Impaired  Confusion None  Danger to Self  Current suicidal ideation? Denies  Agreement Not to Harm Self Yes  Description of Agreement verbal  Danger to Others  Danger to Others None reported or observed   Patient denies any withdrawal symptoms. Appropriate with staff & peers. Noted two bruises on left shoulder. Patient doesn't know how did that happen.No pain verbalized.

## 2023-10-25 NOTE — Progress Notes (Signed)
 NUTRITION ASSESSMENT  Pt identified as at risk on the Malnutrition Screen Tool  INTERVENTION:  -Continue regular diet -Ensure Plus High Protein po TID, each supplement provides 350 kcal and 20 grams of protein  -MVI with minerals daily -100 mg thiamine  daily -1 mg folic acid  daily  NUTRITION DIAGNOSIS: Unintentional weight loss related to sub-optimal intake as evidenced by pt report.   Goal: Pt to meet >/= 90% of their estimated nutrition needs.  Monitor:  PO intake  Assessment:   53 year old white male with no prior psychiatric history but with history of alcohol use presented after falling down approximately 3 steps in the context of alcohol intoxication.   Per H&P, pt consumed pesticide (D-Tron Landy) PTA in attempt to harm himself.   Pt is separated from his wife 5 months ago and currently lives with his mother.   Pt currently on a regular diet. No meal completion data available to assessment at this time.   Reviewed wt hx; noted distant history of weight loss. Per CareEverywhere, pt was 260# on 04/23/23. Pt has experienced a 20% wt loss over the past 6 months, which is significant for time frame.   Pt is at high risk for malnutrition secondary to ETOH abuse and significant wt loss, however, unable to identify at this time. Pt would greatly benefit from addition of oral nutrition supplements.   Medications reviewed and include MVI and thiamine .   Labs reviewed: Na: 134, CBGS: 199.    53 y.o. male  Height: Ht Readings from Last 1 Encounters:  10/23/23 6' (1.829 m)    Weight: Wt Readings from Last 1 Encounters:  10/23/23 94.3 kg    Weight Hx: Wt Readings from Last 10 Encounters:  10/23/23 94.3 kg  06/07/13 (!) 160.6 kg  05/31/13 (!) 156.5 kg  03/15/13 (!) 145.2 kg  01/09/12 (!) 163.3 kg  09/07/11 (!) 145.2 kg  02/16/11 131.5 kg  02/15/11 131.5 kg  01/24/11 124.7 kg    BMI:  Body mass index is 28.21 kg/m. Pt meets criteria for overweight based on  current BMI.  Estimated Nutritional Needs: Kcal: 25-30 kcal/kg Protein: > 1 gram protein/kg Fluid: 1 ml/kcal  Diet Order:  Diet Order             Diet regular Room service appropriate? Yes; Fluid consistency: Thin  Diet effective now                  Pt is also offered choice of unit snacks mid-morning and mid-afternoon.  Pt is eating as desired.   Lab results and medications reviewed.   Margery ORN, RD, LDN, CDCES Registered Dietitian III Certified Diabetes Care and Education Specialist If unable to reach this RD, please use RD Inpatient group chat on secure chat between hours of 8am-4 pm daily

## 2023-10-25 NOTE — Group Note (Signed)
 Recreation Therapy Group Note   Group Topic:Coping Skills  Group Date: 10/25/2023 Start Time: 1230 End Time: 1320 Facilitators: Celestia Jeoffrey BRAVO, LRT, CTRS Location: Craft Room  Group Description: Mind Map.  Patient was provided a blank template of a diagram with 32 blank boxes in a tiered system, branching from the center (similar to a bubble chart). LRT directed patients to label the middle of the diagram Coping Skills. LRT and patients then came up with 8 different coping skills as examples. Pt were directed to record their coping skills in the 2nd tier boxes closest to the center.  Patients would then share their coping skills with the group as LRT wrote them out. LRT gave a handout of 99 different coping skills at the end of group.   Goal Area(s) Addressed: Patients will be able to define "coping skills". Patient will identify new coping skills.  Patient will increase communication.   Affect/Mood: N/A   Participation Level: Did not attend    Clinical Observations/Individualized Feedback: Patient did not attend group.   Plan: Continue to engage patient in RT group sessions 2-3x/week.   Jeoffrey BRAVO Celestia, LRT, CTRS 10/25/2023 2:03 PM

## 2023-10-25 NOTE — Plan of Care (Signed)

## 2023-10-25 NOTE — BH IP Treatment Plan (Signed)
 Interdisciplinary Treatment and Diagnostic Plan Update  10/25/2023 Time of Session: 10:15 Scott Larson MRN: [id number]  Principal Diagnosis: MDD (major depressive disorder), severe (HCC)  Secondary Diagnoses: Principal Problem:   MDD (major depressive disorder), severe (HCC)   Current Medications:  Current Facility-Administered Medications  Medication Dose Route Frequency Provider Last Rate Last Admin   acetaminophen  (TYLENOL ) tablet 650 mg  650 mg Oral Q6H PRN Coleman, Carolyn H, NP       alum & mag hydroxide-simeth (MAALOX/MYLANTA) 200-200-20 MG/5ML suspension 30 mL  30 mL Oral Q4H PRN Coleman, Carolyn H, NP   30 mL at 10/24/23 9491   chlordiazePOXIDE  (LIBRIUM ) capsule 25 mg  25 mg Oral Q6H PRN Mardy Elveria DEL, NP       chlordiazePOXIDE  (LIBRIUM ) capsule 25 mg  25 mg Oral Letha Mardy Elveria DEL, NP   25 mg at 10/25/23 0556   Followed by   NOREEN ON 10/26/2023] chlordiazePOXIDE  (LIBRIUM ) capsule 25 mg  25 mg Oral Daily Coleman, Carolyn H, NP       haloperidol  (HALDOL ) tablet 5 mg  5 mg Oral TID PRN Mardy Elveria DEL, NP       And   diphenhydrAMINE  (BENADRYL ) capsule 50 mg  50 mg Oral TID PRN Mardy Elveria DEL, NP       haloperidol  lactate (HALDOL ) injection 5 mg  5 mg Intramuscular TID PRN Mardy Elveria DEL, NP       And   diphenhydrAMINE  (BENADRYL ) injection 50 mg  50 mg Intramuscular TID PRN Mardy Elveria DEL, NP       And   LORazepam  (ATIVAN ) injection 2 mg  2 mg Intramuscular TID PRN Coleman, Carolyn H, NP       haloperidol  lactate (HALDOL ) injection 10 mg  10 mg Intramuscular TID PRN Mardy Elveria DEL, NP       And   diphenhydrAMINE  (BENADRYL ) injection 50 mg  50 mg Intramuscular TID PRN Mardy Elveria DEL, NP       And   LORazepam  (ATIVAN ) injection 2 mg  2 mg Intramuscular TID PRN Coleman, Carolyn H, NP       feeding supplement (ENSURE PLUS HIGH PROTEIN) liquid 237 mL  237 mL Oral TID BM Jadapalle, Sree, MD   237 mL at 10/25/23 0857   folic acid  (FOLVITE )  tablet 1 mg  1 mg Oral Daily Jadapalle, Sree, MD   1 mg at 10/25/23 0857   hydrOXYzine  (ATARAX ) tablet 25 mg  25 mg Oral Q6H PRN Coleman, Carolyn H, NP       loperamide  (IMODIUM ) capsule 2-4 mg  2-4 mg Oral PRN Coleman, Carolyn H, NP       LORazepam  (ATIVAN ) tablet 2 mg  2 mg Oral Q6H PRN Mardy Elveria DEL, NP       Or   LORazepam  (ATIVAN ) injection 2 mg  2 mg Intramuscular Q8H PRN Coleman, Carolyn H, NP       magnesium  hydroxide (MILK OF MAGNESIA) suspension 30 mL  30 mL Oral Daily PRN Mardy Elveria DEL, NP       multivitamin with minerals tablet 1 tablet  1 tablet Oral Daily Mardy Elveria DEL, NP   1 tablet at 10/25/23 0856   nicotine  (NICODERM CQ  - dosed in mg/24 hours) patch 14 mg  14 mg Transdermal Daily Millington, Matthew E, PA-C   14 mg at 10/25/23 0857   ondansetron  (ZOFRAN -ODT) disintegrating tablet 4 mg  4 mg Oral Q6H PRN Mardy Elveria DEL, NP  thiamine  (VITAMIN B1) injection 100 mg  100 mg Intramuscular Once Mardy Elveria DEL, NP       traZODone  (DESYREL ) tablet 50 mg  50 mg Oral QHS PRN Coleman, Carolyn H, NP   50 mg at 10/24/23 2116   PTA Medications: Medications Prior to Admission  Medication Sig Dispense Refill Last Dose/Taking   aspirin 81 MG tablet Take 81 mg by mouth daily.      MULTIPLE VITAMIN PO Take by mouth.       Patient Stressors:    Patient Strengths:    Treatment Modalities: Medication Management, Group therapy, Case management,  1 to 1 session with clinician, Psychoeducation, Recreational therapy.   Physician Treatment Plan for Primary Diagnosis: MDD (major depressive disorder), severe (HCC) Long Term Goal(s): Improvement in symptoms so as ready for discharge   Short Term Goals: Ability to identify changes in lifestyle to reduce recurrence of condition will improve Ability to verbalize feelings will improve Ability to disclose and discuss suicidal ideas Ability to demonstrate self-control will improve Ability to identify triggers associated  with substance abuse/mental health issues will improve  Medication Management: Evaluate patient's response, side effects, and tolerance of medication regimen.  Therapeutic Interventions: 1 to 1 sessions, Unit Group sessions and Medication administration.  Evaluation of Outcomes: Not Met  Physician Treatment Plan for Secondary Diagnosis: Principal Problem:   MDD (major depressive disorder), severe (HCC)  Long Term Goal(s): Improvement in symptoms so as ready for discharge   Short Term Goals: Ability to identify changes in lifestyle to reduce recurrence of condition will improve Ability to verbalize feelings will improve Ability to disclose and discuss suicidal ideas Ability to demonstrate self-control will improve Ability to identify triggers associated with substance abuse/mental health issues will improve     Medication Management: Evaluate patient's response, side effects, and tolerance of medication regimen.  Therapeutic Interventions: 1 to 1 sessions, Unit Group sessions and Medication administration.  Evaluation of Outcomes: Not Met   RN Treatment Plan for Primary Diagnosis: MDD (major depressive disorder), severe (HCC) Long Term Goal(s): Knowledge of disease and therapeutic regimen to maintain health will improve  Short Term Goals: Ability to remain free from injury will improve, Ability to verbalize frustration and anger appropriately will improve, Ability to demonstrate self-control, Ability to participate in decision making will improve, Ability to verbalize feelings will improve, Ability to disclose and discuss suicidal ideas, Ability to identify and develop effective coping behaviors will improve, and Compliance with prescribed medications will improve  Medication Management: RN will administer medications as ordered by provider, will assess and evaluate patient's response and provide education to patient for prescribed medication. RN will report any adverse and/or side  effects to prescribing provider.  Therapeutic Interventions: 1 on 1 counseling sessions, Psychoeducation, Medication administration, Evaluate responses to treatment, Monitor vital signs and CBGs as ordered, Perform/monitor CIWA, COWS, AIMS and Fall Risk screenings as ordered, Perform wound care treatments as ordered.  Evaluation of Outcomes: Not Met   LCSW Treatment Plan for Primary Diagnosis: MDD (major depressive disorder), severe (HCC) Long Term Goal(s): Safe transition to appropriate next level of care at discharge, Engage patient in therapeutic group addressing interpersonal concerns.  Short Term Goals: Engage patient in aftercare planning with referrals and resources, Increase social support, Increase ability to appropriately verbalize feelings, Increase emotional regulation, Facilitate acceptance of mental health diagnosis and concerns, Facilitate patient progression through stages of change regarding substance use diagnoses and concerns, Identify triggers associated with mental health/substance abuse issues, and Increase skills for  wellness and recovery  Therapeutic Interventions: Assess for all discharge needs, 1 to 1 time with Social worker, Explore available resources and support systems, Assess for adequacy in community support network, Educate family and significant other(s) on suicide prevention, Complete Psychosocial Assessment, Interpersonal group therapy.  Evaluation of Outcomes: Not Met   Progress in Treatment: Attending groups: Yes. Participating in groups: Yes. Taking medication as prescribed: Yes. Toleration medication: Yes. Family/Significant other contact made: Yes, individual(s) contacted:  girlfriend, Lucy Digidio. Patient understands diagnosis: Yes. Discussing patient identified problems/goals with staff: Yes. Medical problems stabilized or resolved: Yes. Denies suicidal/homicidal ideation: Yes. Issues/concerns per patient self-inventory: No. Other: none.   New  problem(s) identified: No, Describe:  none identified.  New Short Term/Long Term Goal(s): detox, medication management for mood stabilization; elimination of SI thoughts; development of comprehensive mental wellness/sobriety plan.  Patient Goals:  Realize I made a big mistake with my drinking.   Discharge Plan or Barriers: CSW will assist pt with development of an appropriate aftercare/discharge plan.   Reason for Continuation of Hospitalization: Depression Medication stabilization Suicidal ideation  Estimated Length of Stay: 1-7 days  Last 3 [place] Suicide Severity Risk Score: Flowsheet Row Admission (Current) from 10/23/2023 in [facility] INPATIENT [facility]AL [facility]INE  C-SSRS RISK CATEGORY No Risk    Last PHQ 2/9 Scores:     No data to display          Scribe for Treatment Team: Nadara JONELLE Fam, LCSW 10/25/2023 11:05 AM

## 2023-10-25 NOTE — BHH Counselor (Addendum)
 CSW attempted to contact pt girlfriend, Dorian Digidio [phone]). Contact was unable to be established but HIPAA compliant voicemail left with contact information for follow through.   Nadara SAUNDERS. Chaim, MSW, LCSW, LCAS 10/25/2023 3:57 PM  ADDENDUM at 4:32PM  CSW phoned by Dorian Digidio. She shared that pt came to the hospital because he had gotten so drunk that she came to visit him and it took him several minutes to get himself to the door, after knocking for awhile. She denied feeling that pt is a danger to himself or anyone else. She stated that he maybe a danger if he continues to drink. She did report that pt has access to a gun at home but will be staying with her for awhile after discharge. She inquired about the medication that he was on and was informed that this was detox protocol in the hospital. She asked if pt would be given anything for anxiety at discharge. CSW shared that he was uncertain and this was a question for the provider. No other concerns expressed. Contact ended without incident.   CSW notified team of questions/concerns via secure chat.   Nadara SAUNDERS. Chaim, MSW, LCSW, LCAS 10/25/2023 4:54 PM

## 2023-10-25 NOTE — Progress Notes (Signed)
 Pt calm and pleasant during assessment denying SI/HI/AVH. Pt observed by this Clinical research associate interacting appropriately with staff and peers on the unit. Pt compliant with medication administration per MD orders. Pt given education, support, and encouragement to be active in his treatment plan. Pt being monitored Q 15 minutes for safety per unit protocol, remains safe on the unit

## 2023-10-26 MED ORDER — NICOTINE 14 MG/24HR TD PT24: 14.0000 mg | MEDICATED_PATCH | Freq: Every day | TRANSDERMAL | 0 refills | Status: AC

## 2023-10-26 MED ORDER — HYDROXYZINE HCL 25 MG PO TABS: 25.0000 mg | ORAL_TABLET | Freq: Four times a day (QID) | ORAL | 0 refills | Status: AC | PRN

## 2023-10-26 MED ORDER — FOLIC ACID 1 MG PO TABS: 1.0000 mg | ORAL_TABLET | Freq: Every day | ORAL | 0 refills | Status: AC

## 2023-10-26 NOTE — [facility] Suicide Risk Assessment (Signed)
 [facility] Discharge Suicide Risk Assessment   Principal Problem: MDD (major depressive disorder), severe (HCC) Discharge Diagnoses: Principal Problem:   MDD (major depressive disorder), severe (HCC)   Total Time spent with patient: 30 minutes  Musculoskeletal: Strength & Muscle Tone: within normal limits Gait & Station: normal Patient leans: N/A  Psychiatric Specialty Exam  Presentation  General Appearance:  Appropriate for Environment; Casual  Eye Contact: Fair  Speech: Clear and Coherent  Speech Volume: Normal  Handedness: Right   Mood and Affect  Mood: Euthymic  Duration of Depression Symptoms: No data recorded Affect: Appropriate   Thought Process  Thought Processes: Coherent  Descriptions of Associations:Intact  Orientation:Full (Time, Place and Person)  Thought Content:Logical  History of Schizophrenia/Schizoaffective disorder:No data recorded Duration of Psychotic Symptoms:No data recorded Hallucinations:Hallucinations: None  Ideas of Reference:None  Suicidal Thoughts:Suicidal Thoughts: No  Homicidal Thoughts:Homicidal Thoughts: No   Sensorium  Memory: Immediate Fair; Recent Fair; Remote Fair  Judgment: Fair  Insight: None   Executive Functions  Concentration: Fair  Attention Span: Fair  Recall: [employer] of Knowledge: Fair  Language: Fair   Psychomotor Activity  Psychomotor Activity: Psychomotor Activity: Normal   Assets  Assets: Communication Skills; Resilience   Sleep  Sleep: Sleep: Fair  Estimated Sleeping Duration (Last 24 Hours): 6.25-8.75 hours  Physical Exam: Physical Exam Vitals and nursing note reviewed.    ROS Blood pressure 115/76, pulse (!) 54, temperature 97.6 F (36.4 C), temperature source Oral, resp. rate 18, height 6' (1.829 m), weight 94.3 kg, SpO2 99%. Body mass index is 28.21 kg/m.  Mental Status Per Nursing Assessment::   On Admission:  NA  Demographic Factors:   Male  Loss Factors: Decrease in vocational status  Historical Factors: Impulsivity  Risk Reduction Factors:   Living with another person, especially a relative, Positive social support, Positive therapeutic relationship, and Positive coping skills or problem solving skills  Continued Clinical Symptoms:  Depression:   Comorbid alcohol abuse/dependence  Cognitive Features That Contribute To Risk:  None    Suicide Risk:  Minimal: No identifiable suicidal ideation.  Patients presenting with no risk factors but with morbid ruminations; may be classified as minimal risk based on the severity of the depressive symptoms    Plan Of Care/Follow-up recommendations:  Activity:  As tolerated  Allyn Foil, MD 10/26/2023, 7:36 AM

## 2023-10-26 NOTE — Group Note (Signed)
 Recreation Therapy Group Note   Group Topic:Emotion Expression  Group Date: 10/26/2023 Start Time: 1000 End Time: 1055 Facilitators: Celestia Jeoffrey BRAVO, LRT, CTRS Location: Craft Room  Group Description: Painting a Diplomatic Services operational officer. Patients and LRT discuss what it means to be "at peace", what it feels like physically and mentally. Pts are given a canvas and watercolor paint to use and encouraged to draw their idea of a peaceful place. Pts and LRT discuss how they use this in their daily life post discharge. Pts are encouraged to take their canvas home with them as a reminder to find their peaceful place whenever they are feeling depressed, anxious, etc.    Goal Area(s) Addressed:  Patient will identify what it means to experience a "peaceful" emotion. Patient will identify a new coping skill.  Patient will express their emotions through art. Patients will increase communication by talking with LRT and peers while in group.   Affect/Mood: N/A   Participation Level: Did not attend    Clinical Observations/Individualized Feedback: Patient did not attend group.   Plan: Continue to engage patient in RT group sessions 2-3x/week.   Jeoffrey BRAVO Celestia, LRT, CTRS 10/26/2023 1:05 PM

## 2023-10-26 NOTE — Plan of Care (Signed)
   Problem: Education: Goal: Emotional status will improve Outcome: Progressing Goal: Mental status will improve Outcome: Progressing

## 2023-10-26 NOTE — Progress Notes (Signed)
 Patient pleasant and cooperative on approach. Denies SI,HI and AVH. Verbalized understanding discharge instructions,prescriptions and follow up care.  All belongings returned from [employer] and from Contractor. Suicide safety plan filled by patient and placed in chart. Copy given to patient.Patient escorted out by staff and transported by family.

## 2023-10-26 NOTE — Progress Notes (Signed)
   10/26/23 0900  Psych Admission Type (Psych Patients Only)  Admission Status Voluntary  Psychosocial Assessment  Patient Complaints None  Eye Contact Fair  Facial Expression Animated  Affect Appropriate to circumstance  Speech Soft  Interaction Assertive  Motor Activity Slow  Appearance/Hygiene Unremarkable  Behavior Characteristics Cooperative;Appropriate to situation  Mood Pleasant  Thought Process  Coherency WDL  Content WDL  Delusions None reported or observed  Perception WDL  Hallucination None reported or observed  Judgment Impaired  Confusion None  Danger to Self  Current suicidal ideation? Denies  Agreement Not to Harm Self Yes  Description of Agreement verbal  Danger to Others  Danger to Others None reported or observed

## 2023-10-26 NOTE — Progress Notes (Signed)
  [facility] Adult Case Management Discharge Plan :  Will you be returning to the same living situation after discharge:  Yes,  pt reports that he is going home.  At discharge, do you have transportation home?: Yes,  pt reports that family(girlfriend or mother) will provide transportation.  Do you have the ability to pay for your medications: No.  Release of information consent forms completed and in the chart;  Patient's signature needed at discharge.  Patient to Follow up at:  Follow-up Information     [facility] Follow up.   Why: Walk in hours are frm 8AM to 5PM Monday through Friday, please arrive early for walk in hours. Contact information: [address] [place] [address] [phone]                 Next level of care provider has access to [facility] Link:no  Safety Planning and Suicide Prevention discussed: Yes,  SPE completed with the patient and patients collateral.     Has patient been referred to the Quitline?: Patient refused referral for treatment  Patient has been referred for addiction treatment: Patient refused referral for treatment.  Sherryle JINNY Margo, LCSW 10/26/2023, 9:14 AM

## 2023-10-27 NOTE — Discharge Summary (Signed)
 Physician Discharge Summary Note  Patient:  Scott Larson is an 53 y.o., male MRN:  [id number] DOB:  02-08-1971 Patient phone:  [phone] (home)  Patient address:   [address] [place] [address],   Total time spent: 40 min Date of Admission:  10/23/2023 Date of Discharge: 10/26/23  Reason for Admission:   Patient is a 53 year old white male with no prior psychiatric history but with history of alcohol use was presented to the ED by EMS after falling down approximately 3 steps in the context of alcohol intoxication. Fall was unwitnessed, unknown loss of consciousness. Per chart patient reportedly acknowledged that he drank pesticide earlier. Per chart patient stated this was a substance called D-Tron Landy. Per chart patient informed ED provider that he did the ingestion as an attempt to harm himself. Per ED note patient is unable or unwilling to give any other history. Patient is currently admitted to adult inpatient psychiatric unit for observation and stabilization.   Principal Problem: MDD (major depressive disorder), severe (HCC) Discharge Diagnoses: Principal Problem:   MDD (major depressive disorder), severe (HCC)   Past Psychiatric History: see h&p  Family Psychiatric  History: see h&p Social History:  Social History   Substance and Sexual Activity  Alcohol Use Yes   Comment: Previous 1/4 to 1/5 per day; none at present     Social History   Substance and Sexual Activity  Drug Use No    Social History   Socioeconomic History   Marital status: Married    Spouse name: Not on file   Number of children: 2   Years of education: Not on file   Highest education level: Not on file  Occupational History   Occupation: Patent examiner    Employer: [employer]  Tobacco Use   Smoking status: Never   Smokeless tobacco: Current  Vaping Use   Vaping status: Every Day  Substance and Sexual Activity   Alcohol use: Yes    Comment: Previous 1/4 to 1/5 per day;  none at present   Drug use: No   Sexual activity: Not on file  Other Topics Concern   Not on file  Social History Narrative   Not on file   Social Drivers of Health   Financial Resource Strain: Not on file  Food Insecurity: No Food Insecurity (10/23/2023)   Hunger Vital Sign    Worried About Running Out of Food in the Last Year: Never true    Ran Out of Food in the Last Year: Never true  Transportation Needs: No Transportation Needs (10/23/2023)   PRAPARE - Administrator, Civil Service (Medical): No    Lack of Transportation (Non-Medical): No  Physical Activity: Not on file  Stress: Not on file  Social Connections: Not on file   Past Medical History:  Past Medical History:  Diagnosis Date   Asthma    Hyperlipidemia    Obesity    Prostatitis     Past Surgical History:  Procedure Laterality Date   ANUS SURGERY     Pyloric stenosis     Family History:  Family History  Problem Relation Age of Onset   Heart disease Neg Hx     Hospital Course:   Patient is a 53 year old white male with no prior psychiatric history but with history of alcohol use was presented to the ED by EMS after falling down approximately 3 steps in the context of alcohol intoxication. Fall was unwitnessed, unknown loss of  consciousness. Per chart patient reportedly acknowledged that he drank pesticide earlier. Per chart patient stated this was a substance called D-Tron Landy. Per chart patient informed ED provider that he did the ingestion as an attempt to harm himself. Per ED note patient is unable or unwilling to give any other history. Patient is currently admitted to adult inpatient psychiatric unit for observation and stabilization.  Detailed risk assessment is complete based on clinical exam and individual risk factors and acute suicide risk is low and acute violence risk is low.     And admission patient was very adamant that he did not do any overdose.  He did talk about psychosocial  stressors and alcohol use.  He denies any symptoms of depression or anxiety and declined any psychotropic medications during this hospitalization..  Throughout the admission patient maintained safe behaviors, noted to be engaging in groups.  On the day of discharge he continues to deny SI/HI/plan and denies hallucinations.  He remains future oriented and is willing to participate in outpatient resources. Currently, all modifiable risk of harm to self/harm to others have been addressed and patient is no longer appropriate for the acute inpatient setting and is able to continue treatment for mental health needs in the community with the supports as indicated below.  Patient is educated and verbalized understanding of discharge plan of care including medications, follow-up appointments, mental health resources and further crisis services in the community.  He is instructed to call 911 or present to the nearest emergency room should he experience any decompensation in mood, disturbance of bowel or return of suicidal/homicidal ideations.  Patient verbalizes understanding of this education and agrees to this plan of care  Physical Findings: AIMS:  , ,  ,  ,    CIWA:  CIWA-Ar Total: 0 COWS:        Psychiatric Specialty Exam:  Presentation  General Appearance:  Appropriate for Environment; Casual  Eye Contact: Fair  Speech: Clear and Coherent  Speech Volume: Normal    Mood and Affect  Mood: Euthymic  Affect: Appropriate   Thought Process  Thought Processes: Coherent  Descriptions of Associations:Intact  Orientation:Full (Time, Place and Person)  Thought Content:Logical  Hallucinations:Hallucinations: None  Ideas of Reference:None  Suicidal Thoughts:Suicidal Thoughts: No  Homicidal Thoughts:Homicidal Thoughts: No   Sensorium  Memory: Immediate Fair; Recent Fair; Remote Fair  Judgment: Fair  Insight: None   Executive Functions   Concentration: Fair  Attention Span: Fair  Recall: [employer] of Knowledge: Fair  Language: Fair   Psychomotor Activity  Psychomotor Activity: Psychomotor Activity: Normal  Musculoskeletal: Strength & Muscle Tone: within normal limits Gait & Station: normal Assets  Assets: Manufacturing systems engineer; Resilience   Sleep  Sleep: Sleep: Fair    Physical Exam: Physical Exam ROS Blood pressure 115/76, pulse (!) 54, temperature 97.6 F (36.4 C), temperature source Oral, resp. rate 18, height 6' (1.829 m), weight 94.3 kg, SpO2 99%. Body mass index is 28.21 kg/m.   Social History   Tobacco Use  Smoking Status Never  Smokeless Tobacco Current   Tobacco Cessation:  A prescription for an FDA-approved tobacco cessation medication was offered at discharge and the patient refused   Blood Alcohol level:  No results found for: [place]  Metabolic Disorder Labs:  No results found for: HGBA1C, MPG No results found for: PROLACTIN No results found for: CHOL, TRIG, HDL, CHOLHDL, VLDL, LDLCALC  See Psychiatric Specialty Exam and Suicide Risk Assessment completed by Attending Physician prior to discharge.  Discharge  destination:  Home  Is patient on multiple antipsychotic therapies at discharge:  No   Has Patient had three or more failed trials of antipsychotic monotherapy by history:  No  Recommended Plan for Multiple Antipsychotic Therapies: NA   Allergies as of 10/26/2023       Reactions   Aspirin Other (See Comments)   High doses cause cold and flu like symptoms and trigger asthma         Medication List     STOP taking these medications    MULTIPLE VITAMIN PO       TAKE these medications      Indication  aspirin 81 MG tablet Take 81 mg by mouth daily.  Indication: 21-Hydroxylase Deficiency   folic acid  1 MG tablet Commonly known as: FOLVITE  Take 1 tablet (1 mg total) by mouth daily.  Indication: Anemia From Inadequate Folic  Acid   hydrOXYzine  25 MG tablet Commonly known as: ATARAX  Take 1 tablet (25 mg total) by mouth every 6 (six) hours as needed for anxiety (or CIWA score </= 10).  Indication: Feeling Anxious   nicotine  14 mg/24hr patch Commonly known as: NICODERM CQ  - dosed in mg/24 hours Place 1 patch (14 mg total) onto the skin daily.  Indication: Nicotine  Addiction        Follow-up Information     [facility] Follow up.   Why: Walk in hours are frm 8AM to 5PM Monday through Friday, please arrive early for walk in hours. Contact information: [address] [place] [address] [phone]                 Follow-up recommendations:  Activity:  As tolerated    Signed: Andrez Lieurance, MD 10/27/2023, 10:29 PM
